# Patient Record
Sex: Female | Born: 1985 | Race: White | Hispanic: No | Marital: Single | Smoking: Current some day smoker
Health system: Southern US, Community
[De-identification: ages and names within clinical notes are randomized; demographics above are authoritative.]

## PROBLEM LIST (undated history)

## (undated) DIAGNOSIS — F419 Anxiety disorder, unspecified: Secondary | ICD-10-CM

## (undated) DIAGNOSIS — F32A Depression, unspecified: Secondary | ICD-10-CM

## (undated) DIAGNOSIS — D500 Iron deficiency anemia secondary to blood loss (chronic): Secondary | ICD-10-CM

## (undated) DIAGNOSIS — F329 Major depressive disorder, single episode, unspecified: Secondary | ICD-10-CM

## (undated) HISTORY — DX: Anxiety disorder, unspecified: F41.9

## (undated) HISTORY — DX: Depression, unspecified: F32.A

## (undated) HISTORY — PX: CHOLECYSTECTOMY: SHX55

## (undated) HISTORY — PX: WISDOM TOOTH EXTRACTION: SHX21

---
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
## 2009-05-31 ENCOUNTER — Observation Stay: Payer: Self-pay

## 2009-06-02 ENCOUNTER — Inpatient Hospital Stay: Payer: Self-pay

## 2013-06-19 ENCOUNTER — Ambulatory Visit: Payer: Self-pay | Admitting: Gastroenterology

## 2013-07-12 ENCOUNTER — Emergency Department: Payer: Self-pay | Admitting: Emergency Medicine

## 2013-07-12 LAB — CBC WITH DIFFERENTIAL/PLATELET
BASOS ABS: 0 10*3/uL (ref 0.0–0.1)
Basophil %: 0.4 %
EOS ABS: 0.2 10*3/uL (ref 0.0–0.7)
Eosinophil %: 2.1 %
HCT: 43.2 % (ref 35.0–47.0)
HGB: 14 g/dL (ref 12.0–16.0)
LYMPHS PCT: 31.5 %
Lymphocyte #: 2.7 10*3/uL (ref 1.0–3.6)
MCH: 29.4 pg (ref 26.0–34.0)
MCHC: 32.3 g/dL (ref 32.0–36.0)
MCV: 91 fL (ref 80–100)
MONO ABS: 0.4 x10 3/mm (ref 0.2–0.9)
MONOS PCT: 4.1 %
Neutrophil #: 5.3 10*3/uL (ref 1.4–6.5)
Neutrophil %: 61.9 %
Platelet: 272 10*3/uL (ref 150–440)
RBC: 4.75 10*6/uL (ref 3.80–5.20)
RDW: 13 % (ref 11.5–14.5)
WBC: 8.6 10*3/uL (ref 3.6–11.0)

## 2013-07-12 LAB — URINALYSIS, COMPLETE
BILIRUBIN, UR: NEGATIVE
Blood: NEGATIVE
Glucose,UR: NEGATIVE mg/dL (ref 0–75)
Leukocyte Esterase: NEGATIVE
Nitrite: NEGATIVE
Ph: 6 (ref 4.5–8.0)
Protein: NEGATIVE
RBC,UR: 4 /HPF (ref 0–5)
SPECIFIC GRAVITY: 1.026 (ref 1.003–1.030)
Squamous Epithelial: 1
WBC UR: 1 /HPF (ref 0–5)

## 2013-07-12 LAB — COMPREHENSIVE METABOLIC PANEL
ALBUMIN: 3.9 g/dL (ref 3.4–5.0)
ALT: 28 U/L (ref 12–78)
Alkaline Phosphatase: 96 U/L
Anion Gap: 5 — ABNORMAL LOW (ref 7–16)
BUN: 10 mg/dL (ref 7–18)
Bilirubin,Total: 0.5 mg/dL (ref 0.2–1.0)
CALCIUM: 8.6 mg/dL (ref 8.5–10.1)
Chloride: 106 mmol/L (ref 98–107)
Co2: 26 mmol/L (ref 21–32)
Creatinine: 0.85 mg/dL (ref 0.60–1.30)
EGFR (African American): >60
EGFR (Non-African Amer.): >60
GLUCOSE: 133 mg/dL — AB (ref 65–99)
Osmolality: 275 (ref 275–301)
Potassium: 3.9 mmol/L (ref 3.5–5.1)
SGOT(AST): 31 U/L (ref 15–37)
SODIUM: 137 mmol/L (ref 136–145)
TOTAL PROTEIN: 7.9 g/dL (ref 6.4–8.2)

## 2013-07-12 LAB — TROPONIN I: Troponin-I: <0.02 ng/mL

## 2013-07-12 LAB — LIPASE, BLOOD: Lipase: 120 U/L (ref 73–393)

## 2013-07-13 LAB — PREGNANCY, URINE: Pregnancy Test, Urine: NEGATIVE m[IU]/mL

## 2013-08-21 ENCOUNTER — Ambulatory Visit: Payer: Self-pay | Admitting: Surgery

## 2013-08-24 LAB — PATHOLOGY REPORT

## 2013-12-22 ENCOUNTER — Telehealth: Payer: Self-pay

## 2014-02-16 NOTE — Telephone Encounter (Signed)
Error

## 2014-05-08 NOTE — Op Note (Signed)
PATIENT NAME:  Rose Baker, [name] MR#:  [id number] DATE OF BIRTH:  [birth year]  DATE OF PROCEDURE:  08/21/2013  PREOPERATIVE DIAGNOSIS: Chronic cholecystitis, cholelithiasis.   POSTOPERATIVE DIAGNOSIS: Chronic cholecystitis, cholelithiasis.   PROCEDURE: Laparoscopic cholecystectomy, cholangiogram.   SURGEON: Renda RollsWilton Smith, MD  ANESTHESIA: General.   INDICATIONS: This 29 year old female recently had ultrasound that demonstrated gallstones. She also reports some intermittent right upper quadrant pains. Surgery was recommended for definitive treatment.   DESCRIPTION OF PROCEDURE: The patient was placed on the operating table in the supine position under general endotracheal anesthesia. The abdomen was prepared with ChloraPrep and draped in a sterile manner.   A short incision was made deep within the inferior aspect of the umbilicus and carried down to the deep fascia which was grasped with laryngeal hook and elevated. A Veress needle was inserted, aspirated and irrigated with a saline solution. Next, the peritoneal cavity was inflated with carbon dioxide. The Veress needle was removed. The 10 mm cannula was inserted. The 10 mm, 0 degree laparoscope was inserted to view the peritoneal cavity. Initially the pressures did not rise up to 15 mmHg as desired and it was necessary to change the carbon dioxide tubing and ultimately changed the air pressure pump and subsequently had satisfactory pressure and could see satisfactorily. Another incision was made in the epigastrium slightly to the right of the midline to introduce an 11 mm cannula. Two cannulas were inserted through small incisions in the lateral aspect of the right upper quadrant.  The liver appeared normal. The gallbladder was retracted towards the right shoulder. The infundibulum was retracted inferiorly and laterally. The porta hepatis was noted. Dissection was carried out to mobilize the gallbladder neck with incision of the visceral peritoneum.  The cystic duct was dissected free from surrounding structures. The cystic artery was dissected free from surrounding structures. A critical view of safety was demonstrated. An Endo Clip was placed across the cystic duct adjacent to the neck of the gallbladder. An incision was made in the cystic duct to introduce a Reddick catheter. Half-strength Conray-60 dye was injected as the cholangiogram was done with fluoroscopy demonstrating the biliary tree and prompt flow of dye into the duodenum. No retained stones were seen. The Reddick catheter was removed. The cystic duct was doubly ligated with endoclips and divided. The cystic artery was controlled with double endoclips and divided. The gallbladder was dissected free from the liver with hook and cautery. The gallbladder was delivered up through the infraumbilical incision, opened, suctioned and removed. It is noted that during the course of the procedure a picture was taken of the gallbladder and of the opened gallbladder and of the stone at the patient's request. It is further noted that the carbon dioxide was allowed to escape from the peritoneal cavity, as the cannulas were removed. Skin incisions were closed with interrupted 5-0 chromic subcuticular sutures, benzoin and Steri-Strips. Dressings were applied with paper tape. The patient tolerated surgery satisfactorily and was prepared for transfer to the recovery room.  ____________________________ Shela CommonsJ. Renda RollsWilton Smith, MD [email or web address] D: 08/21/2013 09:03:20 ET T: 08/21/2013 11:25:05 ET JOB#: [id number]  cc: Adella HareJ. Wilton Smith, MD, <Dictator> Adella HareWILTON J SMITH MD ELECTRONICALLY SIGNED 08/27/2013 18:33

## 2014-10-19 ENCOUNTER — Emergency Department
Admission: EM | Admit: 2014-10-19 | Discharge: 2014-10-19 | Disposition: A | Discharge status: Home / Self Care | Payer: Medicaid Other | Attending: Emergency Medicine | Admitting: Emergency Medicine

## 2014-10-19 DIAGNOSIS — Z720 Tobacco use: Secondary | ICD-10-CM | POA: Diagnosis not present

## 2014-10-19 DIAGNOSIS — J020 Streptococcal pharyngitis: Secondary | ICD-10-CM | POA: Diagnosis not present

## 2014-10-19 DIAGNOSIS — J029 Acute pharyngitis, unspecified: Secondary | ICD-10-CM | POA: Diagnosis present

## 2014-10-19 DIAGNOSIS — R000 Tachycardia, unspecified: Secondary | ICD-10-CM | POA: Insufficient documentation

## 2014-10-19 MED ORDER — ACETAMINOPHEN 325 MG PO TABS
650.0000 mg | ORAL_TABLET | Freq: Once | ORAL | Status: AC | PRN
  Administered 2014-10-19: 650 mg via ORAL

## 2014-10-19 MED ORDER — ACETAMINOPHEN 325 MG PO TABS
ORAL_TABLET | ORAL | Status: AC
  Administered 2014-10-19: 650 mg via ORAL
  Filled 2014-10-19: qty 2

## 2014-10-19 MED ORDER — PREDNISONE 50 MG PO TABS: ORAL_TABLET | ORAL | Status: DC

## 2014-10-19 MED ORDER — IBUPROFEN 800 MG PO TABS: 800.0000 mg | ORAL_TABLET | Freq: Three times a day (TID) | ORAL | Status: DC | PRN

## 2014-10-19 MED ORDER — AMOXICILLIN-POT CLAVULANATE 875-125 MG PO TABS: 1.0000 | ORAL_TABLET | Freq: Two times a day (BID) | ORAL | Status: AC

## 2014-10-19 NOTE — Discharge Instructions (Signed)

## 2014-10-19 NOTE — ED Notes (Signed)
Pt c/o sore throat with fever, bodyaches since sunday

## 2014-10-19 NOTE — ED Provider Notes (Signed)
[facility] Emergency Department Provider Note     Time seen: ----------------------------------------- 2:56 PM on 10/19/2014 -----------------------------------------    I have reviewed the triage vital signs and the nursing notes.   HISTORY  Chief Complaint Sore Throat and Fever    HPI Rose Baker is a 29 y.o. female who presents ER with several days of sore throat fever and bodyaches. Patient states it hurts to swallow but started as a mild sore throat. She has had fever and chills and difficulty swallowing.   History reviewed. No pertinent past medical history.  There are no active problems to display for this patient.   Past Surgical History  Procedure Laterality Date  . Cholecystectomy      Allergies Review of patient's allergies indicates no known allergies.  Social History Social History  Substance Use Topics  . Smoking status: Current Some Day Smoker    Types: Cigarettes  . Smokeless tobacco: None  . Alcohol Use: Yes    Review of Systems Constitutional: As if her fever Eyes: Negative for visual changes. ENT: Positive for sore throat, swollen glands Cardiovascular: Negative for chest pain. Respiratory: Negative for shortness of breath. Gastrointestinal: Negative for abdominal pain, vomiting and diarrhea. Genitourinary: Negative for dysuria. Musculoskeletal: Positive for bodyaches  Skin: Negative for rash. Neurological: Negative for headaches, focal weakness or numbness.  10-point ROS otherwise negative.  ____________________________________________   PHYSICAL EXAM:  VITAL SIGNS: ED Triage Vitals  Enc Vitals Group     BP 10/19/14 1417 118/77 mmHg     Pulse Rate 10/19/14 1417 132     Resp 10/19/14 1417 20     Temp 10/19/14 1417 102.2 F (39 C)     Temp Source 10/19/14 1417 Oral     SpO2 10/19/14 1417 97 %     Weight 10/19/14 1417 200 lb (90.719 kg)     Height 10/19/14 1417  (1.778 m)     Head Cir --       Peak Flow --      Pain Score 10/19/14 1427 7     Pain Loc --      Pain Edu? --      Excl. in GC? --     Constitutional: Alert and oriented. Well appearing and in no distress. Eyes: Conjunctivae are normal. PERRL. Normal extraocular movements. ENT   Head: Normocephalic and atraumatic.   Nose: No congestion/rhinnorhea.   Mouth/Throat: Markedly tongue slurred hypertrophy with erythema and exudate   Neck: Anterior cervical adenopathy Cardiovascular: Rapid rate, regular rhythm. Normal and symmetric distal pulses are present in all extremities. No murmurs, rubs, or gallops. Respiratory: Normal respiratory effort without tachypnea nor retractions. Breath sounds are clear and equal bilaterally. No wheezes/rales/rhonchi. Gastrointestinal: Soft and nontender. No distention. No abdominal bruits.  Musculoskeletal: Nontender with normal range of motion in all extremities. No joint effusions.  No lower extremity tenderness nor edema. Neurologic:  Normal speech and language. No gross focal neurologic deficits are appreciated. Speech is normal. No gait instability. Skin:  Skin is warm, dry and intact. No rash noted. Psychiatric: Mood and affect are normal. Speech and behavior are normal. Patient exhibits appropriate insight and judgment.  ____________________________________________  ED COURSE:  Pertinent labs & imaging results that were available during my care of the patient were reviewed by me and considered in my medical decision making (see chart for details). Patient's no acute distress, will check for strep and reevaluate ____________________________________________    LABS (pertinent positives/negatives)  Patient with a  rapid strep test that is positive   ____________________________________________  FINAL ASSESSMENT AND PLAN  Strep pharyngitis  Plan: Patient with labs and imaging as dictated above. Patient is no acute distress, is positive for strep pharyngitis.  She'll be discharged with Augmentin and a steroid taper. She is encouraged to return for worsening or worrisome symptoms.   Emily Filbert, MD   Emily Filbert, MD 10/19/14 [phone]

## 2014-10-21 LAB — POCT RAPID STREP A: STREPTOCOCCUS, GROUP A SCREEN (DIRECT): POSITIVE — AB

## 2015-04-04 ENCOUNTER — Ambulatory Visit: Payer: Self-pay | Admitting: Neurology

## 2015-04-06 ENCOUNTER — Ambulatory Visit: Payer: Self-pay | Admitting: Neurology

## 2016-02-10 DIAGNOSIS — R110 Nausea: Secondary | ICD-10-CM | POA: Diagnosis not present

## 2016-02-10 DIAGNOSIS — F17210 Nicotine dependence, cigarettes, uncomplicated: Secondary | ICD-10-CM | POA: Insufficient documentation

## 2016-02-10 DIAGNOSIS — R197 Diarrhea, unspecified: Secondary | ICD-10-CM | POA: Insufficient documentation

## 2016-02-10 DIAGNOSIS — Z79899 Other long term (current) drug therapy: Secondary | ICD-10-CM | POA: Diagnosis not present

## 2016-02-10 DIAGNOSIS — R109 Unspecified abdominal pain: Secondary | ICD-10-CM | POA: Diagnosis not present

## 2016-02-10 DIAGNOSIS — Z791 Long term (current) use of non-steroidal anti-inflammatories (NSAID): Secondary | ICD-10-CM | POA: Diagnosis not present

## 2016-02-11 ENCOUNTER — Emergency Department
Admission: EM | Admit: 2016-02-11 | Discharge: 2016-02-11 | Disposition: A | Discharge status: Home / Self Care | Payer: Medicaid Other | Attending: Emergency Medicine | Admitting: Emergency Medicine

## 2016-02-11 ENCOUNTER — Encounter: Payer: Self-pay | Admitting: Emergency Medicine

## 2016-02-11 DIAGNOSIS — R110 Nausea: Secondary | ICD-10-CM

## 2016-02-11 DIAGNOSIS — R197 Diarrhea, unspecified: Secondary | ICD-10-CM

## 2016-02-11 LAB — CBC
HEMATOCRIT: 42.3 % (ref 35.0–47.0)
HEMOGLOBIN: 14.8 g/dL (ref 12.0–16.0)
MCH: 30.5 pg (ref 26.0–34.0)
MCHC: 34.8 g/dL (ref 32.0–36.0)
MCV: 87.5 fL (ref 80.0–100.0)
Platelets: 227 10*3/uL (ref 150–440)
RBC: 4.84 MIL/uL (ref 3.80–5.20)
RDW: 12.5 % (ref 11.5–14.5)
WBC: 7.7 10*3/uL (ref 3.6–11.0)

## 2016-02-11 LAB — COMPREHENSIVE METABOLIC PANEL
ALBUMIN: 4.4 g/dL (ref 3.5–5.0)
ALK PHOS: 67 U/L (ref 38–126)
ALT: 30 U/L (ref 14–54)
ANION GAP: 5 (ref 5–15)
AST: 29 U/L (ref 15–41)
BUN: 14 mg/dL (ref 6–20)
CALCIUM: 8.6 mg/dL — AB (ref 8.9–10.3)
CO2: 26 mmol/L (ref 22–32)
Chloride: 106 mmol/L (ref 101–111)
Creatinine, Ser: 0.76 mg/dL (ref 0.44–1.00)
GFR calc Af Amer: >60 mL/min (ref >60)
GFR calc non Af Amer: >60 mL/min (ref >60)
GLUCOSE: 99 mg/dL (ref 65–99)
Potassium: 3.5 mmol/L (ref 3.5–5.1)
SODIUM: 137 mmol/L (ref 135–145)
Total Bilirubin: 0.6 mg/dL (ref 0.3–1.2)
Total Protein: 7.6 g/dL (ref 6.5–8.1)

## 2016-02-11 LAB — URINALYSIS, COMPLETE (UACMP) WITH MICROSCOPIC
BILIRUBIN URINE: NEGATIVE
GLUCOSE, UA: NEGATIVE mg/dL
HGB URINE DIPSTICK: NEGATIVE
Ketones, ur: 5 mg/dL — AB
Leukocytes, UA: NEGATIVE
Nitrite: NEGATIVE
PH: 5 (ref 5.0–8.0)
Protein, ur: NEGATIVE mg/dL
SPECIFIC GRAVITY, URINE: 1.033 — AB (ref 1.005–1.030)

## 2016-02-11 LAB — LIPASE, BLOOD: Lipase: 25 U/L (ref 11–51)

## 2016-02-11 LAB — POCT PREGNANCY, URINE: Preg Test, Ur: NEGATIVE

## 2016-02-11 MED ORDER — ONDANSETRON HCL 4 MG PO TABS: 4.0000 mg | ORAL_TABLET | Freq: Three times a day (TID) | ORAL | 0 refills | Status: DC | PRN

## 2016-02-11 MED ORDER — DICYCLOMINE HCL 20 MG PO TABS
20.0000 mg | ORAL_TABLET | Freq: Three times a day (TID) | ORAL | 0 refills | Status: DC | PRN
Start: 2016-02-11 — End: 2018-12-25

## 2016-02-11 NOTE — ED Triage Notes (Signed)
Pt states that she has been having diarrhea x3 days. Pt also reports that "something is wrong with my rectum". Pt reports that "something is coming out". Pt has been Pepto Bismol to know effect. Pt is ambulatory in triage with NAD noted at this time.

## 2016-02-11 NOTE — ED Provider Notes (Signed)
[facility] Emergency Department Provider Note  ____________________________________________   I have reviewed the triage vital signs and the nursing notes.   HISTORY  Chief Complaint Diarrhea   History limited by: Not Limited   HPI Rose Baker is a 31 y.o. female who presents to the emergency department today because of concerns for abdominal pain and diarrhea. The patient states that the symptoms started a few days ago. She describes the pain as cramping. It is around all of the abdomen. This has been accompanied by diarrhea. The patient has also had nausea although no vomiting. The patient has not had any fevers. Has tried Pepto-Bismol without any relief. Denies any abnormal ingestions, recent travel. No known sick contacts.  History reviewed. No pertinent past medical history.  There are no active problems to display for this patient.   Past Surgical History:  Procedure Laterality Date  . CHOLECYSTECTOMY      Prior to Admission medications   Medication Sig Start Date End Date Taking? Authorizing Provider  ibuprofen (ADVIL,MOTRIN) 800 MG tablet Take 1 tablet (800 mg total) by mouth every 8 (eight) hours as needed. 10/19/14   Emily FilbertJonathan E Williams, MD  predniSONE (DELTASONE) 50 MG tablet Take one tablet by mouth daily 10/19/14   Emily FilbertJonathan E Williams, MD    Allergies Patient has no known allergies.  No family history on file.  Social History Social History  Substance Use Topics  . Smoking status: Current Some Day Smoker    Types: Cigarettes  . Smokeless tobacco: Never Used  . Alcohol use Yes    Review of Systems  Constitutional: Negative for fever. Cardiovascular: Negative for chest pain. Respiratory: Negative for shortness of breath. Gastrointestinal: Positive for abdominal pain, diarrhea and nausea.  Neurological: Negative for headaches, focal weakness or numbness.  10-point ROS otherwise  negative.  ____________________________________________   PHYSICAL EXAM:  VITAL SIGNS: ED Triage Vitals  Enc Vitals Group     BP 02/11/16 0023 109/71     Pulse Rate 02/11/16 0023 76     Resp 02/11/16 0023 16     Temp 02/11/16 0023 98 F (36.7 C)     Temp Source 02/11/16 0023 Oral     SpO2 02/11/16 0023 96 %     Weight 02/11/16 0020 220 lb (99.8 kg)     Height 02/11/16 0020 5\' 9"  (1.753 m)     Head Circumference --      Peak Flow --      Pain Score 02/11/16 0021 7   Constitutional: Alert and oriented. Well appearing and in no distress. Eyes: Conjunctivae are normal. Normal extraocular movements. ENT   Head: Normocephalic and atraumatic.   Nose: No congestion/rhinnorhea.   Mouth/Throat: Mucous membranes are moist.   Neck: No stridor. Hematological/Lymphatic/Immunilogical: No cervical lymphadenopathy. Cardiovascular: Normal rate, regular rhythm.  No murmurs, rubs, or gallops.  Respiratory: Normal respiratory effort without tachypnea nor retractions. Breath sounds are clear and equal bilaterally. No wheezes/rales/rhonchi. Gastrointestinal: Soft and non tender. No rebound. No guarding.  Genitourinary: Deferred Musculoskeletal: Normal range of motion in all extremities. No lower extremity edema. Neurologic:  Normal speech and language. No gross focal neurologic deficits are appreciated.  Skin:  Skin is warm, dry and intact. No rash noted. Psychiatric: Mood and affect are normal. Speech and behavior are normal. Patient exhibits appropriate insight and judgment.  ____________________________________________    LABS (pertinent positives/negatives)  Labs Reviewed  COMPREHENSIVE METABOLIC PANEL - Abnormal; Notable for the following:  Result Value   Calcium 8.6 (*)    All other components within normal limits  URINALYSIS, COMPLETE (UACMP) WITH MICROSCOPIC - Abnormal; Notable for the following:    Color, Urine YELLOW (*)    APPearance CLEAR (*)    Specific  Gravity, Urine 1.033 (*)    Ketones, ur 5 (*)    All other components within normal limits  LIPASE, BLOOD  CBC  POCT PREGNANCY, URINE     ____________________________________________   EKG  None  ____________________________________________    RADIOLOGY  None  ____________________________________________   PROCEDURES  Procedures  ____________________________________________   INITIAL IMPRESSION / ASSESSMENT AND PLAN / ED COURSE  Pertinent labs & imaging results that were available during my care of the patient were reviewed by me and considered in my medical decision making (see chart for details).  Patient presents to the emergency department today because of concerns for abdominal cramping, diarrhea and nausea. I think likely patient with gastroenteritis. Blood work without any concerning findings. Patient does also complain of a sore rectal area she thinks from the diarrhea. Did discuss patient that we will discharge with those medicines as well as Bentyl for the abdominal cramping.  ____________________________________________   FINAL CLINICAL IMPRESSION(S) / ED DIAGNOSES  Final diagnoses:  Diarrhea, unspecified type  Nausea     Note: This dictation was prepared with Dragon dictation. Any transcriptional errors that result from this process are unintentional     Phineas Semen, MD 02/11/16 [phone]

## 2016-02-11 NOTE — Discharge Instructions (Signed)
Please seek medical attention for any high fevers, chest pain, shortness of breath, change in behavior, persistent vomiting, bloody stool or any other new or concerning symptoms.  

## 2016-02-11 NOTE — ED Notes (Addendum)
Patient c/o diarrhea X 3 days and rectal pain. Patient reports black watery diarrhea. Patient reports bright red blood in stools

## 2016-03-29 ENCOUNTER — Ambulatory Visit: Payer: Self-pay | Admitting: Obstetrics and Gynecology

## 2016-06-25 ENCOUNTER — Emergency Department
Admission: EM | Admit: 2016-06-25 | Discharge: 2016-06-25 | Disposition: A | Discharge status: Home / Self Care | Payer: Medicaid Other | Attending: Emergency Medicine | Admitting: Emergency Medicine

## 2016-06-25 ENCOUNTER — Encounter: Payer: Self-pay | Admitting: Emergency Medicine

## 2016-06-25 DIAGNOSIS — F17210 Nicotine dependence, cigarettes, uncomplicated: Secondary | ICD-10-CM | POA: Insufficient documentation

## 2016-06-25 DIAGNOSIS — Z79899 Other long term (current) drug therapy: Secondary | ICD-10-CM | POA: Insufficient documentation

## 2016-06-25 DIAGNOSIS — R109 Unspecified abdominal pain: Secondary | ICD-10-CM

## 2016-06-25 DIAGNOSIS — Z9049 Acquired absence of other specified parts of digestive tract: Secondary | ICD-10-CM | POA: Diagnosis not present

## 2016-06-25 DIAGNOSIS — Z791 Long term (current) use of non-steroidal anti-inflammatories (NSAID): Secondary | ICD-10-CM | POA: Diagnosis not present

## 2016-06-25 DIAGNOSIS — R112 Nausea with vomiting, unspecified: Secondary | ICD-10-CM | POA: Diagnosis present

## 2016-06-25 DIAGNOSIS — R1031 Right lower quadrant pain: Secondary | ICD-10-CM | POA: Diagnosis not present

## 2016-06-25 LAB — COMPREHENSIVE METABOLIC PANEL
ALBUMIN: 4.6 g/dL (ref 3.5–5.0)
ALT: 14 U/L (ref 14–54)
ANION GAP: 8 (ref 5–15)
AST: 24 U/L (ref 15–41)
Alkaline Phosphatase: 58 U/L (ref 38–126)
BILIRUBIN TOTAL: 1.2 mg/dL (ref 0.3–1.2)
BUN: 14 mg/dL (ref 6–20)
CO2: 25 mmol/L (ref 22–32)
Calcium: 9.3 mg/dL (ref 8.9–10.3)
Chloride: 110 mmol/L (ref 101–111)
Creatinine, Ser: 0.87 mg/dL (ref 0.44–1.00)
GFR calc Af Amer: >60 mL/min (ref >60)
GFR calc non Af Amer: >60 mL/min (ref >60)
GLUCOSE: 140 mg/dL — AB (ref 65–99)
POTASSIUM: 3.7 mmol/L (ref 3.5–5.1)
SODIUM: 143 mmol/L (ref 135–145)
TOTAL PROTEIN: 8 g/dL (ref 6.5–8.1)

## 2016-06-25 LAB — CBC
HEMATOCRIT: 45.5 % (ref 35.0–47.0)
HEMOGLOBIN: 15.5 g/dL (ref 12.0–16.0)
MCH: 30.3 pg (ref 26.0–34.0)
MCHC: 34 g/dL (ref 32.0–36.0)
MCV: 89.1 fL (ref 80.0–100.0)
Platelets: 272 10*3/uL (ref 150–440)
RBC: 5.11 MIL/uL (ref 3.80–5.20)
RDW: 12.4 % (ref 11.5–14.5)
WBC: 13 10*3/uL — ABNORMAL HIGH (ref 3.6–11.0)

## 2016-06-25 LAB — URINALYSIS, COMPLETE (UACMP) WITH MICROSCOPIC
BILIRUBIN URINE: NEGATIVE
Bacteria, UA: NONE SEEN
Glucose, UA: NEGATIVE mg/dL
KETONES UR: 5 mg/dL — AB
LEUKOCYTES UA: NEGATIVE
NITRITE: NEGATIVE
PROTEIN: 30 mg/dL — AB
Specific Gravity, Urine: 1.03 (ref 1.005–1.030)
pH: 5 (ref 5.0–8.0)

## 2016-06-25 LAB — PREGNANCY, URINE: PREG TEST UR: NEGATIVE

## 2016-06-25 LAB — LIPASE, BLOOD: Lipase: 21 U/L (ref 11–51)

## 2016-06-25 MED ORDER — ONDANSETRON HCL 4 MG PO TABS: 4.0000 mg | ORAL_TABLET | Freq: Three times a day (TID) | ORAL | 0 refills | Status: DC | PRN

## 2016-06-25 MED ORDER — DICYCLOMINE HCL 10 MG PO CAPS: 20.0000 mg | ORAL_CAPSULE | Freq: Once | ORAL | Status: DC

## 2016-06-25 MED ORDER — ONDANSETRON 4 MG PO TBDP
4.0000 mg | ORAL_TABLET | Freq: Once | ORAL | Status: AC
  Administered 2016-06-25: 4 mg via ORAL
  Filled 2016-06-25: qty 1

## 2016-06-25 MED ORDER — DICYCLOMINE HCL 10 MG/ML IM SOLN
20.0000 mg | Freq: Once | INTRAMUSCULAR | Status: DC
  Filled 2016-06-25: qty 2

## 2016-06-25 MED ORDER — DICYCLOMINE HCL 20 MG PO TABS
ORAL_TABLET | ORAL | Status: AC
  Administered 2016-06-25: 20 mg
  Filled 2016-06-25: qty 1

## 2016-06-25 MED ORDER — DICYCLOMINE HCL 20 MG PO TABS: 20.0000 mg | ORAL_TABLET | Freq: Three times a day (TID) | ORAL | 0 refills | Status: DC | PRN

## 2016-06-25 NOTE — ED Provider Notes (Signed)
[facility] Emergency Department Provider Note   ____________________________________________   I have reviewed the triage vital signs and the nursing notes.   HISTORY  Chief Complaint Abdominal Pain; Emesis; and Diarrhea   History limited by: Not Limited   HPI Rose Baker is a 31 y.o. female who presents to the emergency department today because of concerns for abdominal pain, nausea vomiting. The patient states the symptoms started yesterday. The abdominal pain is located centrally. She describes it as being sharp. It is very intermittent. It will last for about 1 minute and then slowly go away. The patient states that this has been accompanied by nausea and inability to tolerate by mouth. She has also had some diarrhea. No unusual ingestions. No known sick contacts. Patient denies similar symptoms in the past.   History reviewed. No pertinent past medical history.  There are no active problems to display for this patient.   Past Surgical History:  Procedure Laterality Date  . CHOLECYSTECTOMY      Prior to Admission medications   Medication Sig Start Date End Date Taking? Authorizing Provider  dicyclomine (BENTYL) 20 MG tablet Take 1 tablet (20 mg total) by mouth 3 (three) times daily as needed (abdominal pain). 02/11/16   Phineas SemenGoodman, Mikenzie Mccannon, MD  ibuprofen (ADVIL,MOTRIN) 800 MG tablet Take 1 tablet (800 mg total) by mouth every 8 (eight) hours as needed. 10/19/14   Emily FilbertWilliams, Jonathan E, MD  ondansetron (ZOFRAN) 4 MG tablet Take 1 tablet (4 mg total) by mouth every 8 (eight) hours as needed for nausea or vomiting. 02/11/16   Phineas SemenGoodman, Mckenzye Cutright, MD  predniSONE (DELTASONE) 50 MG tablet Take one tablet by mouth daily 10/19/14   Emily FilbertWilliams, Jonathan E, MD    Allergies Patient has no known allergies.  No family history on file.  Social History Social History  Substance Use Topics  . Smoking status: Current Some Day Smoker    Types: Cigarettes  . Smokeless  tobacco: Never Used  . Alcohol use Yes    Review of Systems Constitutional: No fever/chills Eyes: No visual changes. ENT: No sore throat. Cardiovascular: Denies chest pain. Respiratory: Denies shortness of breath. Gastrointestinal: Positive for abdominal pain, nausea, vomiting and diarrhea. Genitourinary: Negative for dysuria. Musculoskeletal: Negative for back pain. Skin: Negative for rash. Neurological: Negative for headaches, focal weakness or numbness.  ____________________________________________   PHYSICAL EXAM:  VITAL SIGNS: ED Triage Vitals [06/25/16 1207]  Enc Vitals Group     BP 125/72     Pulse Rate (!) 58     Resp 18     Temp 98.3 F (36.8 C)     Temp Source Oral     SpO2 98 %     Weight 220 lb (99.8 kg)     Height 5\' 9"  (1.753 m)     Head Circumference      Peak Flow      Pain Score 10   Constitutional: Alert and oriented. Well appearing and in no distress. Eyes: Conjunctivae are normal.  ENT   Head: Normocephalic and atraumatic.   Nose: No congestion/rhinnorhea.   Mouth/Throat: Mucous membranes are moist.   Neck: No stridor. Hematological/Lymphatic/Immunilogical: No cervical lymphadenopathy. Cardiovascular: Normal rate, regular rhythm.  No murmurs, rubs, or gallops.  Respiratory: Normal respiratory effort without tachypnea nor retractions. Breath sounds are clear and equal bilaterally. No wheezes/rales/rhonchi. Gastrointestinal: Soft and tender to palpation somewhat diffusely. No rebound. No guarding.  Genitourinary: Deferred Musculoskeletal: Normal range of motion in all extremities. No lower extremity  edema. Neurologic:  Normal speech and language. No gross focal neurologic deficits are appreciated.  Skin:  Skin is warm, dry and intact. No rash noted. Psychiatric: Mood and affect are normal. Speech and behavior are normal. Patient exhibits appropriate insight and judgment.  ____________________________________________    LABS  (pertinent positives/negatives)  Labs Reviewed  COMPREHENSIVE METABOLIC PANEL - Abnormal; Notable for the following:       Result Value   Glucose, Bld 140 (*)    All other components within normal limits  CBC - Abnormal; Notable for the following:    WBC 13.0 (*)    All other components within normal limits  URINALYSIS, COMPLETE (UACMP) WITH MICROSCOPIC - Abnormal; Notable for the following:    Color, Urine YELLOW (*)    APPearance HAZY (*)    Hgb urine dipstick SMALL (*)    Ketones, ur 5 (*)    Protein, ur 30 (*)    Squamous Epithelial / LPF 0-5 (*)    All other components within normal limits  LIPASE, BLOOD  PREGNANCY, URINE     ____________________________________________   EKG  None  ____________________________________________    RADIOLOGY  None  ____________________________________________   PROCEDURES  Procedures  ____________________________________________   INITIAL IMPRESSION / ASSESSMENT AND PLAN / ED COURSE  Pertinent labs & imaging results that were available during my care of the patient were reviewed by me and considered in my medical decision making (see chart for details).  Patient presented to the emergency department today because of concerns for nausea vomiting abdominal pain and diarrhea. Patient did feel better after Bentyl and Zofran. Blood work with a very mild leukocytosis. Abdominal exam is somewhat diffuse tenderness without any focal tenderness in the right lower quadrant. This point I did not think an emergent imaging is necessary. Did discuss return precautions with patient. Will discharge with Bentyl and Zofran.  ____________________________________________   FINAL CLINICAL IMPRESSION(S) / ED DIAGNOSES  Final diagnoses:  Abdominal pain, unspecified abdominal location     Note: This dictation was prepared with Dragon dictation. Any transcriptional errors that result from this process are unintentional     Phineas Semen, MD 06/25/16 1740

## 2016-06-25 NOTE — ED Notes (Signed)
Lab called to add on urine preg at this time  

## 2016-06-25 NOTE — ED Triage Notes (Signed)
Pt reports lower abdominal cramping and vomiting and diarrhea that began last night.

## 2016-06-25 NOTE — Discharge Instructions (Signed)
Please seek medical attention for any high fevers, chest pain, shortness of breath, change in behavior, persistent vomiting, bloody stool or any other new or concerning symptoms.  

## 2017-08-21 ENCOUNTER — Emergency Department
Admission: EM | Admit: 2017-08-21 | Discharge: 2017-08-21 | Disposition: A | Discharge status: Home / Self Care | Payer: Medicaid Other | Attending: Emergency Medicine | Admitting: Emergency Medicine

## 2017-08-21 ENCOUNTER — Encounter: Payer: Self-pay | Admitting: Emergency Medicine

## 2017-08-21 DIAGNOSIS — F17210 Nicotine dependence, cigarettes, uncomplicated: Secondary | ICD-10-CM | POA: Diagnosis not present

## 2017-08-21 DIAGNOSIS — E282 Polycystic ovarian syndrome: Secondary | ICD-10-CM

## 2017-08-21 DIAGNOSIS — N939 Abnormal uterine and vaginal bleeding, unspecified: Secondary | ICD-10-CM | POA: Diagnosis present

## 2017-08-21 DIAGNOSIS — R1030 Lower abdominal pain, unspecified: Secondary | ICD-10-CM | POA: Insufficient documentation

## 2017-08-21 DIAGNOSIS — N938 Other specified abnormal uterine and vaginal bleeding: Secondary | ICD-10-CM | POA: Diagnosis not present

## 2017-08-21 LAB — COMPREHENSIVE METABOLIC PANEL
ALBUMIN: 4.5 g/dL (ref 3.5–5.0)
ALT: 12 U/L (ref 0–44)
AST: 17 U/L (ref 15–41)
Alkaline Phosphatase: 56 U/L (ref 38–126)
Anion gap: 7 (ref 5–15)
BILIRUBIN TOTAL: 1.3 mg/dL — AB (ref 0.3–1.2)
BUN: 13 mg/dL (ref 6–20)
CO2: 27 mmol/L (ref 22–32)
Calcium: 9.2 mg/dL (ref 8.9–10.3)
Chloride: 105 mmol/L (ref 98–111)
Creatinine, Ser: 0.82 mg/dL (ref 0.44–1.00)
GFR calc Af Amer: >60 mL/min (ref >60)
GFR calc non Af Amer: >60 mL/min (ref >60)
GLUCOSE: 99 mg/dL (ref 70–99)
POTASSIUM: 3.9 mmol/L (ref 3.5–5.1)
Sodium: 139 mmol/L (ref 135–145)
TOTAL PROTEIN: 7.9 g/dL (ref 6.5–8.1)

## 2017-08-21 LAB — CBC
HEMATOCRIT: 38.3 % (ref 35.0–47.0)
Hemoglobin: 12.9 g/dL (ref 12.0–16.0)
MCH: 30.3 pg (ref 26.0–34.0)
MCHC: 33.6 g/dL (ref 32.0–36.0)
MCV: 90 fL (ref 80.0–100.0)
Platelets: 294 10*3/uL (ref 150–440)
RBC: 4.26 MIL/uL (ref 3.80–5.20)
RDW: 12.5 % (ref 11.5–14.5)
WBC: 10.6 10*3/uL (ref 3.6–11.0)

## 2017-08-21 LAB — LIPASE, BLOOD: Lipase: 26 U/L (ref 11–51)

## 2017-08-21 LAB — HCG, QUANTITATIVE, PREGNANCY: hCG, Beta Chain, Quant, S: <1 m[IU]/mL (ref <5)

## 2017-08-21 MED ORDER — MEDROXYPROGESTERONE ACETATE 10 MG PO TABS: 20.0000 mg | ORAL_TABLET | Freq: Every day | ORAL | 0 refills | Status: DC

## 2017-08-21 NOTE — ED Triage Notes (Signed)
PT arrived with complaints of lower abdominal pain that started yesterday morning. Pt states she is currently on her period and has had her period consistently for the last month. Pt denies ability to seek medical advice on ongoing vaginal bleeding. Pt in NAD in triage

## 2017-08-21 NOTE — ED Provider Notes (Addendum)
[facility] Emergency Department Provider Note  ____________________________________________  Time seen: Approximately 4:48 PM  I have reviewed the triage vital signs and the nursing notes.   HISTORY  Chief Complaint Abdominal Pain    HPI Rose Baker is a 32 y.o. female with a history of PCOS and cholecystectomy who complains of vaginal bleeding for the past month.  She has had some days of no bleeding, but over the past 4 weeks has bled most days.  Prior to this, she had an Nexplanon for 3 years which was removed about 5 months ago.  During that time she had secondary amenorrhea.  Afterwards she was on oral contraceptives which  were associated with regular menses once monthly.  She stopped those a few months ago because she feels averse to being on oral contraceptives.  In this timeframe she has had a regular periods.  Last sexual activity was greater than a month ago.  She denies any vaginal discharge.  She has some lower abdominal pain that started yesterday, intermittent, no aggravating or alleviating factors, mild, crampy, nonradiating.  No urinary symptoms     History reviewed. No pertinent past medical history. PCOS reported by patient, not on any controlling medications.  There are no active problems to display for this patient.    Past Surgical History:  Procedure Laterality Date  . CHOLECYSTECTOMY       Prior to Admission medications   Medication Sig Start Date End Date Taking? Authorizing Provider  dicyclomine (BENTYL) 20 MG tablet Take 1 tablet (20 mg total) by mouth 3 (three) times daily as needed (abdominal pain). 02/11/16   Phineas Semen, MD  dicyclomine (BENTYL) 20 MG tablet Take 1 tablet (20 mg total) by mouth 3 (three) times daily as needed (abdominal pain). 06/25/16   Phineas Semen, MD  ibuprofen (ADVIL,MOTRIN) 800 MG tablet Take 1 tablet (800 mg total) by mouth every 8 (eight) hours as needed. 10/19/14   Emily Filbert,  MD  medroxyPROGESTERone (PROVERA) 10 MG tablet Take 2 tablets (20 mg total) by mouth daily. 08/21/17   Sharman Cheek, MD  ondansetron (ZOFRAN) 4 MG tablet Take 1 tablet (4 mg total) by mouth every 8 (eight) hours as needed for nausea or vomiting. 02/11/16   Phineas Semen, MD  ondansetron (ZOFRAN) 4 MG tablet Take 1 tablet (4 mg total) by mouth every 8 (eight) hours as needed for nausea or vomiting. 06/25/16   Phineas Semen, MD  predniSONE (DELTASONE) 50 MG tablet Take one tablet by mouth daily 10/19/14   Emily Filbert, MD     Allergies Patient has no known allergies.   No family history on file.  Social History Social History   Tobacco Use  . Smoking status: Current Some Day Smoker    Types: Cigarettes  . Smokeless tobacco: Never Used  Substance Use Topics  . Alcohol use: Yes  . Drug use: No    Review of Systems  Constitutional:   No fever or chills.  ENT:   No sore throat. No rhinorrhea. Cardiovascular:   No chest pain or syncope. Respiratory:   No dyspnea or cough. Gastrointestinal:   Positive as above for lower abdominal pain without vomiting or diarrhea.  Musculoskeletal:   Negative for focal pain or swelling All other systems reviewed and are negative except as documented above in ROS and HPI.  ____________________________________________   PHYSICAL EXAM:  VITAL SIGNS: ED Triage Vitals  Enc Vitals Group     BP 08/21/17 1312 128/82  Pulse Rate 08/21/17 1312 71     Resp 08/21/17 1312 18     Temp 08/21/17 1312 98.5 F (36.9 C)     Temp Source 08/21/17 1312 Oral     SpO2 08/21/17 1312 100 %     Weight 08/21/17 1313 220 lb (99.8 kg)     Height 08/21/17 1313 5\' 9"  (1.753 m)     Head Circumference --      Peak Flow --      Pain Score 08/21/17 1313 8     Pain Loc --      Pain Edu? --      Excl. in GC? --     Vital signs reviewed, nursing assessments reviewed.   Constitutional:   Alert and oriented. Non-toxic appearance. Eyes:    Conjunctivae are normal. EOMI. PERRL. ENT      Head:   Normocephalic and atraumatic.      Nose:   No congestion/rhinnorhea.       Mouth/Throat:   MMM, no pharyngeal erythema. No peritonsillar mass.       Neck:   No meningismus. Full ROM. Hematological/Lymphatic/Immunilogical:   No cervical lymphadenopathy. Cardiovascular:   RRR. Symmetric bilateral radial and DP pulses.  No murmurs. Cap refill less than 2 seconds. Respiratory:   Normal respiratory effort without tachypnea/retractions. Breath sounds are clear and equal bilaterally. No wheezes/rales/rhonchi. Gastrointestinal:   Soft with mild suprapubic tenderness.  Non distended. There is no CVA tenderness.  No rebound, rigidity, or guarding. Musculoskeletal:   Normal range of motion in all extremities. No joint effusions.  No lower extremity tenderness.  No edema. Neurologic:   Normal speech and language.  Motor grossly intact. No acute focal neurologic deficits are appreciated.  Skin:    Skin is warm, dry and intact. No rash noted.  No petechiae, purpura, or bullae.  ____________________________________________    LABS (pertinent positives/negatives) (all labs ordered are listed, but only abnormal results are displayed) Labs Reviewed  COMPREHENSIVE METABOLIC PANEL - Abnormal; Notable for the following components:      Result Value   Total Bilirubin 1.3 (*)    All other components within normal limits  LIPASE, BLOOD  CBC  HCG, QUANTITATIVE, PREGNANCY  URINALYSIS, COMPLETE (UACMP) WITH MICROSCOPIC   ____________________________________________   EKG    ____________________________________________    RADIOLOGY  No results found.  ____________________________________________   PROCEDURES Procedures  ____________________________________________    CLINICAL IMPRESSION / ASSESSMENT AND PLAN / ED COURSE  Pertinent labs & imaging results that were available during my care of the patient were reviewed by me and  considered in my medical decision making (see chart for details).    Patient presents with metrorrhagia in the setting of cessation of hormonal contraceptives and a history of PCOS.  This is not surprising.  I advised the patient she will likely need to remain on hormonal contraceptives to regulate her periods, but she reports a strong desire to not take contraceptives.  Her vital signs are normal, labs today are normal.  Not pregnant.  Patient is amenable to taking a one-week course of Provera for symptom relief.  Advised her that if she continues bleeding after the initial withdrawal bleed when the Provera course is completed she will need to follow-up with gynecology for further evaluation.  Doubt STI or PID at this time, no symptoms to suggest TOA or torsion.  Not appendicitis or UTI      ____________________________________________   FINAL CLINICAL IMPRESSION(S) / ED DIAGNOSES  Final diagnoses:  Dysfunctional uterine bleeding  PCOS (polycystic ovarian syndrome)     ED Discharge Orders        Ordered    medroxyPROGESTERone (PROVERA) 10 MG tablet  Daily     08/21/17 1647      Portions of this note were generated with dragon dictation software. Dictation errors may occur despite best attempts at proofreading.    Sharman CheekStafford, Surabhi Gadea, MD 08/21/17 1653    Sharman CheekStafford, Pedram Goodchild, MD 08/21/17 [phone]

## 2018-01-13 ENCOUNTER — Ambulatory Visit
Admission: EM | Admit: 2018-01-13 | Discharge: 2018-01-13 | Disposition: A | Discharge status: Home / Self Care | Payer: Medicaid Other | Attending: Physician Assistant | Admitting: Physician Assistant

## 2018-01-13 ENCOUNTER — Other Ambulatory Visit: Payer: Self-pay

## 2018-01-13 ENCOUNTER — Encounter: Payer: Self-pay | Admitting: Emergency Medicine

## 2018-01-13 DIAGNOSIS — F17210 Nicotine dependence, cigarettes, uncomplicated: Secondary | ICD-10-CM | POA: Diagnosis not present

## 2018-01-13 DIAGNOSIS — R519 Headache, unspecified: Secondary | ICD-10-CM

## 2018-01-13 DIAGNOSIS — R05 Cough: Secondary | ICD-10-CM | POA: Diagnosis present

## 2018-01-13 DIAGNOSIS — Z791 Long term (current) use of non-steroidal anti-inflammatories (NSAID): Secondary | ICD-10-CM | POA: Diagnosis not present

## 2018-01-13 DIAGNOSIS — Z793 Long term (current) use of hormonal contraceptives: Secondary | ICD-10-CM | POA: Insufficient documentation

## 2018-01-13 DIAGNOSIS — R51 Headache: Secondary | ICD-10-CM | POA: Insufficient documentation

## 2018-01-13 DIAGNOSIS — J101 Influenza due to other identified influenza virus with other respiratory manifestations: Secondary | ICD-10-CM | POA: Insufficient documentation

## 2018-01-13 DIAGNOSIS — Z79899 Other long term (current) drug therapy: Secondary | ICD-10-CM | POA: Diagnosis not present

## 2018-01-13 LAB — RAPID INFLUENZA A&B ANTIGENS
Influenza A (ARMC): NEGATIVE
Influenza B (ARMC): POSITIVE — AB

## 2018-01-13 MED ORDER — OSELTAMIVIR PHOSPHATE 75 MG PO CAPS: 75.0000 mg | ORAL_CAPSULE | Freq: Two times a day (BID) | ORAL | 0 refills | Status: AC

## 2018-01-13 MED ORDER — KETOROLAC TROMETHAMINE 60 MG/2ML IM SOLN
60.0000 mg | Freq: Once | INTRAMUSCULAR | Status: AC
  Administered 2018-01-13: 60 mg via INTRAMUSCULAR

## 2018-01-13 MED ORDER — IBUPROFEN 800 MG PO TABS: 800.0000 mg | ORAL_TABLET | Freq: Three times a day (TID) | ORAL | 0 refills | Status: AC | PRN

## 2018-01-13 NOTE — Discharge Instructions (Addendum)
FLU: Stressed the need to increase rest and fluid/electrolyte intake . Use medications as directed including Tamiflu and OTC cough medications. If breathing becomes a concern follow back up with us or go to the ER. Avoid others and do not return to work/school too soon in order to prevent others from becoming infected. Most get better within 1-2 wks. Call PCP if  trouble breathing or are short of breath, feel pain or pressure in your chest or abdomen, get dizzy, feel confused, or have vomiting  

## 2018-01-13 NOTE — ED Provider Notes (Signed)
[facility]    CSN: [id number] Arrival date & time: 01/13/18  4098     History   Chief Complaint Chief Complaint  Patient presents with  . Headache  . Cough    HPI Rose Baker is a 32 y.o. female. Patient presents today for 1 week history of nasal congestion, headaches, and sinus pressure/pain. Admits to onset of fever up to 101 degrees 2-3 days ago. She says the headache has worsened and she feels pain and pressure behind her eyes. Admits to tenderness of left maxillary sinus. She has no other concerns/complaints today.  She says she has recently been having increased fatigue and body aches. She has taken OTC Mucinex, Theraflu and used nasal sprays without any improvement in symptoms.  HPI  History reviewed. No pertinent past medical history.  There are no active problems to display for this patient.   Past Surgical History:  Procedure Laterality Date  . CHOLECYSTECTOMY      OB History   No obstetric history on file.      Home Medications    Prior to Admission medications   Medication Sig Start Date End Date Taking? Authorizing Provider  dicyclomine (BENTYL) 20 MG tablet Take 1 tablet (20 mg total) by mouth 3 (three) times daily as needed (abdominal pain). 02/11/16   Phineas Semen, MD  dicyclomine (BENTYL) 20 MG tablet Take 1 tablet (20 mg total) by mouth 3 (three) times daily as needed (abdominal pain). 06/25/16   Phineas Semen, MD  ibuprofen (ADVIL,MOTRIN) 800 MG tablet Take 1 tablet (800 mg total) by mouth every 8 (eight) hours as needed for up to 7 days. 01/13/18 01/20/18  Shirlee Latch, PA-C  medroxyPROGESTERone (PROVERA) 10 MG tablet Take 2 tablets (20 mg total) by mouth daily. 08/21/17   Sharman Cheek, MD  ondansetron (ZOFRAN) 4 MG tablet Take 1 tablet (4 mg total) by mouth every 8 (eight) hours as needed for nausea or vomiting. 02/11/16   Phineas Semen, MD  ondansetron (ZOFRAN) 4 MG tablet Take 1 tablet (4 mg total) by mouth every 8  (eight) hours as needed for nausea or vomiting. 06/25/16   Phineas Semen, MD  oseltamivir (TAMIFLU) 75 MG capsule Take 1 capsule (75 mg total) by mouth 2 (two) times daily for 5 days. 01/13/18 01/18/18  Shirlee Latch, PA-C  predniSONE (DELTASONE) 50 MG tablet Take one tablet by mouth daily 10/19/14   Emily Filbert, MD    Family History Family History  Problem Relation Age of Onset  . Healthy Mother   . Diabetes Father     Social History Social History   Tobacco Use  . Smoking status: Current Some Day Smoker    Types: Cigarettes  . Smokeless tobacco: Never Used  Substance Use Topics  . Alcohol use: Not Currently  . Drug use: No     Allergies   Patient has no known allergies.   Review of Systems Review of Systems  Constitutional: Positive for chills, fatigue and fever. Negative for appetite change.  HENT: Positive for congestion, postnasal drip, rhinorrhea, sinus pressure and sinus pain. Negative for ear pain and sore throat.   Eyes: Positive for photophobia and pain (pain behind both eyes). Negative for discharge.  Respiratory: Positive for cough. Negative for shortness of breath and wheezing.   Cardiovascular: Negative for chest pain.  Gastrointestinal: Negative for abdominal pain, nausea and vomiting.  Musculoskeletal: Positive for arthralgias (diffuse body aches). Negative for myalgias (diffuse body aches).  Skin: Negative for  color change and rash.  Allergic/Immunologic: Negative for environmental allergies.  Neurological: Positive for headaches. Negative for dizziness, weakness and light-headedness.  Hematological: Positive for adenopathy.     Physical Exam Triage Vital Signs ED Triage Vitals  Enc Vitals Group     BP 01/13/18 0947 116/83     Pulse Rate 01/13/18 0947 88     Resp 01/13/18 0947 18     Temp 01/13/18 0947 99.8 F (37.7 C)     Temp Source 01/13/18 0947 Oral     SpO2 01/13/18 0947 98 %     Weight 01/13/18 0943 175 lb (79.4 kg)     Height  01/13/18 0943 5\' 9"  (1.753 m)     Head Circumference --      Peak Flow --      Pain Score 01/13/18 0943 10     Pain Loc --      Pain Edu? --      Excl. in GC? --    No data found.  Updated Vital Signs BP 116/83 (BP Location: Left Arm)   Pulse 88   Temp 99.8 F (37.7 C) (Oral)   Resp 18   Ht 5\' 9"  (1.753 m)   Wt 175 lb (79.4 kg)   LMP 01/12/2018   SpO2 98%   BMI 25.84 kg/m        Physical Exam Vitals signs and nursing note reviewed.  Constitutional:      General: She is not in acute distress.    Appearance: She is well-developed and normal weight. She is ill-appearing.  HENT:     Head: Normocephalic and atraumatic.     Nose: Mucosal edema, congestion and rhinorrhea (moderate yellowish rhinorrhea bilat nares) present.     Right Turbinates: Swollen.     Left Turbinates: Swollen.     Left Sinus: Maxillary sinus tenderness present.     Mouth/Throat:     Mouth: Mucous membranes are moist.     Pharynx: Oropharynx is clear.  Eyes:     General: No scleral icterus.    Extraocular Movements: Extraocular movements intact.     Pupils: Pupils are equal, round, and reactive to light. Pupils are equal.  Neck:     Musculoskeletal: Neck supple.  Cardiovascular:     Rate and Rhythm: Normal rate and regular rhythm.     Heart sounds: No murmur.  Pulmonary:     Effort: Pulmonary effort is normal. No respiratory distress.     Breath sounds: Normal breath sounds. No wheezing, rhonchi or rales.  Lymphadenopathy:     Cervical: Cervical adenopathy (bilateral anterior) present.  Skin:    General: Skin is warm and dry.     Findings: No rash.  Neurological:     Mental Status: She is alert and oriented to person, place, and time. Mental status is at baseline.     Motor: No weakness.     Gait: Gait normal.  Psychiatric:        Mood and Affect: Mood normal.        Speech: Speech normal.        Behavior: Behavior normal.        Thought Content: Thought content normal.      UC  Treatments / Results  Labs (all labs ordered are listed, but only abnormal results are displayed) Labs Reviewed  RAPID INFLUENZA A&B ANTIGENS ([facility] ONLY) - Abnormal; Notable for the following components:      Result Value   Influenza B [facility]) POSITIVE (*)  All other components within normal limits    EKG None  Radiology No results found.  Procedures Procedures (including critical care time)  Medications Ordered in UC Medications  ketorolac (TORADOL) injection 60 mg (60 mg Intramuscular Given 01/13/18 1021)    Initial Impression / Assessment and Plan / UC Course  I have reviewed the triage vital signs and the nursing notes.  Pertinent labs & imaging results that were available during my care of the patient were reviewed by me and considered in my medical decision making (see chart for details).    Patient given ketorolac in clinic for headache with near complete relief of headache. Flu B positive. Since fever began about 48 hours ago, will begin Tamiflu. Begin Motrin for headaches and body aches. Advised rest and OTC Tylenol Cold/Sinus. F/u if symptoms worsen at all.   Final Clinical Impressions(s) / UC Diagnoses   Final diagnoses:  Influenza B  Acute nonintractable headache, unspecified headache type     Discharge Instructions     FLU: Stressed the need to increase rest and fluid/electrolyte intake . Use medications as directed including Tamiflu and OTC cough medications. If breathing becomes a concern follow back up with us or go to the ER. Avoid others and do not return to work/school too soon in order to prevent others from becoming infected. Most get better within 1-2 wks. Call PCP if trouble breathing or are short of breath, feel pain or pressure in your chest or abdomen, get dizzy, feel confused, or have vomiting .     ED Prescriptions    Medication Sig Dispense Auth. Provider   oseltamivir (TAMIFLU) 75 MG capsule Take 1 capsule (75 mg total) by mouth 2 (two)  times daily for 5 days. 10 capsule Eusebio FriendlyEaves, Lesley B, PA-C   ibuprofen (ADVIL,MOTRIN) 800 MG tablet Take 1 tablet (800 mg total) by mouth every 8 (eight) hours as needed for up to 7 days. 21 tablet Shirlee LatchEaves, Lesley B, PA-C     Controlled Substance Prescriptions [place] Controlled Substance Registry consulted? Not Applicable   Gareth Morganaves, Lesley B, PA-C 01/15/18 [id number]

## 2018-01-13 NOTE — ED Triage Notes (Signed)
Pt c/o headache, cough, sinus pain, pressure, nasal congestion and chills. Started about a week ago. Have a fever when it started but has not since.

## 2018-03-05 ENCOUNTER — Other Ambulatory Visit: Payer: Self-pay

## 2018-03-05 ENCOUNTER — Ambulatory Visit
Admission: EM | Admit: 2018-03-05 | Discharge: 2018-03-05 | Disposition: A | Discharge status: Home / Self Care | Payer: Medicaid Other | Attending: Family Medicine | Admitting: Family Medicine

## 2018-03-05 ENCOUNTER — Encounter: Payer: Self-pay | Admitting: Emergency Medicine

## 2018-03-05 DIAGNOSIS — B9789 Other viral agents as the cause of diseases classified elsewhere: Secondary | ICD-10-CM

## 2018-03-05 DIAGNOSIS — J038 Acute tonsillitis due to other specified organisms: Secondary | ICD-10-CM | POA: Diagnosis present

## 2018-03-05 LAB — RAPID STREP SCREEN (MED CTR MEBANE ONLY): STREPTOCOCCUS, GROUP A SCREEN (DIRECT): NEGATIVE

## 2018-03-05 LAB — RAPID INFLUENZA A&B ANTIGENS: Influenza A (ARMC): NEGATIVE

## 2018-03-05 LAB — RAPID INFLUENZA A&B ANTIGENS (ARMC ONLY): INFLUENZA B (ARMC): NEGATIVE

## 2018-03-05 MED ORDER — LIDOCAINE VISCOUS HCL 2 % MT SOLN: OROMUCOSAL | 0 refills | Status: DC

## 2018-03-05 MED ORDER — AMOXICILLIN 875 MG PO TABS: 875.0000 mg | ORAL_TABLET | Freq: Two times a day (BID) | ORAL | 0 refills | Status: DC

## 2018-03-05 NOTE — ED Triage Notes (Signed)
Pt c/o sore throat, chills, vomiting, and nausea. Started this morning. She also has pressure behind her eyes.

## 2018-03-05 NOTE — ED Provider Notes (Signed)
[facility]    CSN: [id number] Arrival date & time: 03/05/18  1302     History   Chief Complaint No chief complaint on file.   HPI Rose Baker is a 33 y.o. female.   The history is provided by the patient.  URI  Presenting symptoms: congestion, fever and sore throat   Severity:  Moderate Onset quality:  Sudden Duration:  8 hours Timing:  Constant Progression:  Worsening Chronicity:  New Relieved by:  None tried Ineffective treatments:  None tried Associated symptoms: no wheezing   Associated symptoms comment:  Nausea; vomited once today Risk factors: sick contacts   Risk factors: not elderly, no chronic cardiac disease, no chronic kidney disease, no chronic respiratory disease and no diabetes mellitus     History reviewed. No pertinent past medical history.  There are no active problems to display for this patient.   Past Surgical History:  Procedure Laterality Date  . CHOLECYSTECTOMY      OB History   No obstetric history on file.      Home Medications    Prior to Admission medications   Medication Sig Start Date End Date Taking? Authorizing Provider  amoxicillin (AMOXIL) 875 MG tablet Take 1 tablet (875 mg total) by mouth 2 (two) times daily. 03/05/18   Payton Mccallum, MD  dicyclomine (BENTYL) 20 MG tablet Take 1 tablet (20 mg total) by mouth 3 (three) times daily as needed (abdominal pain). 02/11/16   Phineas Semen, MD  dicyclomine (BENTYL) 20 MG tablet Take 1 tablet (20 mg total) by mouth 3 (three) times daily as needed (abdominal pain). 06/25/16   Phineas Semen, MD  lidocaine (XYLOCAINE) 2 % solution 20 ml gargle and spit q 6 hours prn 03/05/18   Payton Mccallum, MD  medroxyPROGESTERone (PROVERA) 10 MG tablet Take 2 tablets (20 mg total) by mouth daily. 08/21/17   Sharman Cheek, MD  ondansetron (ZOFRAN) 4 MG tablet Take 1 tablet (4 mg total) by mouth every 8 (eight) hours as needed for nausea or vomiting. 02/11/16   Phineas Semen, MD   ondansetron (ZOFRAN) 4 MG tablet Take 1 tablet (4 mg total) by mouth every 8 (eight) hours as needed for nausea or vomiting. 06/25/16   Phineas Semen, MD  predniSONE (DELTASONE) 50 MG tablet Take one tablet by mouth daily 10/19/14   Emily Filbert, MD    Family History Family History  Problem Relation Age of Onset  . Healthy Mother   . Diabetes Father     Social History Social History   Tobacco Use  . Smoking status: Current Some Day Smoker    Types: Cigarettes  . Smokeless tobacco: Never Used  Substance Use Topics  . Alcohol use: Not Currently  . Drug use: No     Allergies   Patient has no known allergies.   Review of Systems Review of Systems  Constitutional: Positive for fever.  HENT: Positive for congestion and sore throat.   Respiratory: Negative for wheezing.      Physical Exam Triage Vital Signs ED Triage Vitals  Enc Vitals Group     BP 03/05/18 1338 124/83     Pulse Rate 03/05/18 1338 92     Resp 03/05/18 1338 18     Temp 03/05/18 1338 (!) 100.6 F (38.1 C)     Temp Source 03/05/18 1338 Oral     SpO2 03/05/18 1338 99 %     Weight 03/05/18 1334 175 lb (79.4 kg)  Height 03/05/18 1334 5\' 9"  (1.753 m)     Head Circumference --      Peak Flow --      Pain Score 03/05/18 1334 9     Pain Loc --      Pain Edu? --      Excl. in GC? --    No data found.  Updated Vital Signs BP 124/83 (BP Location: Left Arm)   Pulse 92   Temp (!) 100.6 F (38.1 C) (Oral)   Resp 18   Ht 5\' 9"  (1.753 m)   Wt 79.4 kg   LMP 02/20/2018 (Approximate)   SpO2 99%   BMI 25.84 kg/m   Visual Acuity Right Eye Distance:   Left Eye Distance:   Bilateral Distance:    Right Eye Near:   Left Eye Near:    Bilateral Near:     Physical Exam Vitals signs and nursing note reviewed.  Constitutional:      General: She is not in acute distress.    Appearance: She is not toxic-appearing or diaphoretic.  HENT:     Right Ear: Tympanic membrane normal.     Left Ear:  Tympanic membrane normal.     Nose: Congestion and rhinorrhea present.     Mouth/Throat:     Pharynx: Oropharyngeal exudate and posterior oropharyngeal erythema present.     Tonsils: Tonsillar exudate present.  Cardiovascular:     Rate and Rhythm: Normal rate and regular rhythm.     Heart sounds: Normal heart sounds.  Pulmonary:     Effort: Pulmonary effort is normal. No respiratory distress.     Breath sounds: Normal breath sounds. No stridor. No wheezing, rhonchi or rales.  Neurological:     Mental Status: She is alert.      UC Treatments / Results  Labs (all labs ordered are listed, but only abnormal results are displayed) Labs Reviewed  RAPID STREP SCREEN ([facility] ONLY)  RAPID INFLUENZA A&B ANTIGENS ([facility] ONLY)  CULTURE, GROUP A STREP [facility])    EKG None  Radiology No results found.  Procedures Procedures (including critical care time)  Medications Ordered in UC Medications - No data to display  Initial Impression / Assessment and Plan / UC Course  I have reviewed the triage vital signs and the nursing notes.  Pertinent labs & imaging results that were available during my care of the patient were reviewed by me and considered in my medical decision making (see chart for details).      Final Clinical Impressions(s) / UC Diagnoses   Final diagnoses:  Acute tonsillitis due to other specified organisms    ED Prescriptions    Medication Sig Dispense Auth. Provider   amoxicillin (AMOXIL) 875 MG tablet Take 1 tablet (875 mg total) by mouth 2 (two) times daily. 20 tablet Daneil Beem, Pamala Hurry, MD   lidocaine (XYLOCAINE) 2 % solution 20 ml gargle and spit q 6 hours prn 100 mL Payton Mccallum, MD     1. Lab results and diagnosis reviewed with patient  2. rx as per orders above; reviewed possible side effects, interactions, risks and benefits  3. Recommend supportive treatment with otc analgesics prn  4. Follow-up prn if symptoms worsen or don't  improve   Controlled Substance Prescriptions [place] Controlled Substance Registry consulted? Not Applicable   Payton Mccallum, MD 03/05/18 [id number]

## 2018-03-05 NOTE — Discharge Instructions (Signed)
Tylenol/advil as needed.

## 2018-03-09 LAB — CULTURE, GROUP A STREP (THRC)

## 2018-07-30 ENCOUNTER — Other Ambulatory Visit: Payer: Self-pay | Admitting: Family

## 2018-07-30 DIAGNOSIS — N390 Urinary tract infection, site not specified: Secondary | ICD-10-CM

## 2018-08-06 ENCOUNTER — Other Ambulatory Visit: Payer: Self-pay

## 2018-08-06 ENCOUNTER — Ambulatory Visit
Admission: RE | Admit: 2018-08-06 | Discharge: 2018-08-06 | Disposition: A | Discharge status: Home / Self Care | Payer: Medicaid Other | Source: Ambulatory Visit | Attending: Family | Admitting: Family

## 2018-08-06 DIAGNOSIS — N390 Urinary tract infection, site not specified: Secondary | ICD-10-CM | POA: Diagnosis present

## 2018-08-11 ENCOUNTER — Inpatient Hospital Stay: Payer: Medicaid Other | Attending: Oncology | Admitting: Oncology

## 2018-08-11 ENCOUNTER — Encounter: Payer: Self-pay | Admitting: Oncology

## 2018-08-11 ENCOUNTER — Other Ambulatory Visit: Payer: Self-pay

## 2018-08-11 VITALS — BP 115/70 | HR 69 | Temp 98.1°F | Ht 69.0 in | Wt 198.2 lb

## 2018-08-11 DIAGNOSIS — Z87442 Personal history of urinary calculi: Secondary | ICD-10-CM | POA: Diagnosis not present

## 2018-08-11 DIAGNOSIS — N926 Irregular menstruation, unspecified: Secondary | ICD-10-CM

## 2018-08-11 DIAGNOSIS — F17210 Nicotine dependence, cigarettes, uncomplicated: Secondary | ICD-10-CM | POA: Diagnosis not present

## 2018-08-11 DIAGNOSIS — D500 Iron deficiency anemia secondary to blood loss (chronic): Secondary | ICD-10-CM | POA: Diagnosis present

## 2018-08-11 DIAGNOSIS — N920 Excessive and frequent menstruation with regular cycle: Secondary | ICD-10-CM | POA: Diagnosis not present

## 2018-08-11 DIAGNOSIS — D649 Anemia, unspecified: Secondary | ICD-10-CM

## 2018-08-11 DIAGNOSIS — R5383 Other fatigue: Secondary | ICD-10-CM | POA: Diagnosis not present

## 2018-08-11 NOTE — Progress Notes (Signed)
Patient here today for new patient consult.   

## 2018-08-11 NOTE — Progress Notes (Signed)
Hematology/Oncology Consult note [facility] [phone] [phone]   Patient Care Team: Margaretann LovelessKhan, Neelam S, MD as PCP - General (Internal Medicine)  REFERRING PROVIDER: Margaretann LovelessKhan, Neelam S, MD CHIEF COMPLAINTS/REASON FOR VISIT:  Evaluation of iron deficiency anemia  HISTORY OF PRESENTING ILLNESS:  Rose Baker is a  33 y.o.  female with PMH listed below was seen in consultation at the request of Margaretann LovelessKhan, Neelam S, MD   for evaluation of iron deficiency anemia.   Reviewed patient's recent labs  Labs revealed anemia with hemoglobin of . 01/21/2018 hemoglobin 10.8, MCV 86, platelet 255, WBC 6.6, 01/21/2018 iron panel showed TIBC 392, iron 26, iron saturation 7, ferritin 7 Folic acid 11 TSH 0.916 Patient was recommended to start oral iron supplementation earlier this year. Patient was also recently seen again by Dr. Welton FlakesKhan recently with reevaluation. She had blood work done.  We obtained lab records from Dr. Shanda BumpsKohn's office. 07/24/2018, CBC showed hemoglobin 10.9, MCV 79, WBC 8.5, platelet count 344,000 07/24/2018 iron panel was repeated.  TIBC 398, iron 55, iron saturation 14. CMP nonremarkable. Patient also had alpha thalassemia DNA analysis which is negative.  Hemoglobin solubility negative.   Associated signs and symptoms: Patient reports fatigue.  Denies SOB with exertion.  Denies weight loss, easy bruising, hematochezia, hemoptysis, hematuria. Context:  History of iron deficiency: Started iron tablets 6 months ago. Rectal bleeding: Denies Menstrual bleeding/ Vaginal bleeding : Patient reports a history of OCP use 3 years ago.  Has been off OCP since then, she has noticed that  since last October, she has had irregular menstrual periods, flow has been heavy for a few days, also sometimes last 2 weeks. Hematemesis or hemoptysis : denies Blood in urine : denies  Last endoscopy: Not available Fatigue: Yes.  SOB: deneis    Review of Systems   Constitutional: Positive for fatigue. Negative for appetite change, chills and fever.  HENT:   Negative for hearing loss and voice change.   Eyes: Negative for eye problems.  Respiratory: Negative for chest tightness and cough.   Cardiovascular: Negative for chest pain.  Gastrointestinal: Negative for abdominal distention, abdominal pain and blood in stool.  Endocrine: Negative for hot flashes.  Genitourinary: Negative for difficulty urinating and frequency.   Musculoskeletal: Negative for arthralgias.  Skin: Negative for itching and rash.  Neurological: Negative for extremity weakness.  Hematological: Negative for adenopathy.  Psychiatric/Behavioral: Negative for confusion.    MEDICAL HISTORY:  History reviewed. No pertinent past medical history.  SURGICAL HISTORY: Past Surgical History:  Procedure Laterality Date  . CHOLECYSTECTOMY      SOCIAL HISTORY: Social History   Socioeconomic History  . Marital status: Single    Spouse name: Not on file  . Number of children: Not on file  . Years of education: Not on file  . Highest education level: Not on file  Occupational History  . Not on file  Social Needs  . Financial resource strain: Not on file  . Food insecurity    Worry: Not on file    Inability: Not on file  . Transportation needs    Medical: Not on file    Non-medical: Not on file  Tobacco Use  . Smoking status: Current Some Day Smoker    Types: Cigarettes  . Smokeless tobacco: Never Used  Substance and Sexual Activity  . Alcohol use: Not Currently  . Drug use: No  . Sexual activity: Yes  Lifestyle  . Physical activity    Days  per week: Not on file    Minutes per session: Not on file  . Stress: Not on file  Relationships  . Social Musicianconnections    Talks on phone: Not on file    Gets together: Not on file    Attends religious service: Not on file    Active member of club or organization: Not on file    Attends meetings of clubs or organizations: Not on  file    Relationship status: Not on file  . Intimate partner violence    Fear of current or ex partner: Not on file    Emotionally abused: Not on file    Physically abused: Not on file    Forced sexual activity: Not on file  Other Topics Concern  . Not on file  Social History Narrative  . Not on file    FAMILY HISTORY: Family History  Problem Relation Age of Onset  . Healthy Mother   . Diabetes Father     ALLERGIES:  has No Known Allergies.  MEDICATIONS:  Current Outpatient Medications  Medication Sig Dispense Refill  . dicyclomine (BENTYL) 20 MG tablet Take 1 tablet (20 mg total) by mouth 3 (three) times daily as needed (abdominal pain). 30 tablet 0   No current facility-administered medications for this visit.      PHYSICAL EXAMINATION: ECOG PERFORMANCE STATUS: 1 - Symptomatic but completely ambulatory Vitals:   08/11/18 1526  BP: 115/70  Pulse: 69  Temp: 98.1 F (36.7 C)   Filed Weights   08/11/18 1526  Weight: 198 lb 3 oz (89.9 kg)    Physical Exam Constitutional:      General: She is not in acute distress.    Appearance: She is obese.  HENT:     Head: Normocephalic and atraumatic.  Eyes:     General: No scleral icterus.    Pupils: Pupils are equal, round, and reactive to light.  Neck:     Musculoskeletal: Normal range of motion and neck supple.  Cardiovascular:     Rate and Rhythm: Normal rate and regular rhythm.     Heart sounds: Normal heart sounds.  Pulmonary:     Effort: Pulmonary effort is normal. No respiratory distress.     Breath sounds: No wheezing.  Abdominal:     General: Bowel sounds are normal. There is no distension.     Palpations: Abdomen is soft. There is no mass.     Tenderness: There is no abdominal tenderness.  Musculoskeletal: Normal range of motion.        General: No deformity.  Skin:    General: Skin is warm and dry.     Findings: No erythema or rash.  Neurological:     Mental Status: She is alert and oriented to  person, place, and time.     Cranial Nerves: No cranial nerve deficit.     Coordination: Coordination normal.  Psychiatric:        Behavior: Behavior normal.        Thought Content: Thought content normal.       CMP Latest Ref Rng & Units 08/21/2017  Glucose 70 - 99 mg/dL 99  BUN 6 - 20 mg/dL 13  Creatinine 1.610.44 - 0.961.00 mg/dL 0.450.82  Sodium 409135 - 811145 mmol/L 139  Potassium 3.5 - 5.1 mmol/L 3.9  Chloride 98 - 111 mmol/L 105  CO2 22 - 32 mmol/L 27  Calcium 8.9 - 10.3 mg/dL 9.2  Total Protein 6.5 - 8.1 g/dL 7.9  Total  Bilirubin 0.3 - 1.2 mg/dL 0.9(W1.3(H)  Alkaline Phos 38 - 126 U/L 56  AST 15 - 41 U/L 17  ALT 0 - 44 U/L 12   CBC Latest Ref Rng & Units 08/21/2017  WBC 3.6 - 11.0 K/uL 10.6  Hemoglobin 12.0 - 16.0 g/dL 11.912.9  Hematocrit 14.735.0 - 47.0 % 38.3  Platelets 150 - 440 K/uL 294     LABORATORY DATA:  I have reviewed the data as listed Lab Results  Component Value Date   WBC 10.6 08/21/2017   HGB 12.9 08/21/2017   HCT 38.3 08/21/2017   MCV 90.0 08/21/2017   PLT 294 08/21/2017   Recent Labs    08/21/17 1316  NA 139  K 3.9  CL 105  CO2 27  GLUCOSE 99  BUN 13  CREATININE 0.82  CALCIUM 9.2  GFRNONAA >60  GFRAA >60  PROT 7.9  ALBUMIN 4.5  AST 17  ALT 12  ALKPHOS 56  BILITOT 1.3*   Iron/TIBC/Ferritin/ %Sat No results found for: IRON, TIBC, FERRITIN, IRONPCTSAT   RADIOGRAPHIC STUDIES: I have personally reviewed the radiological images as listed and agreed with the findings in the report.  [place] Renal  Result Date: 08/06/2018 CLINICAL DATA:  33 year old female with frequent UTIs. EXAM: RENAL / URINARY TRACT ULTRASOUND COMPLETE COMPARISON:  Abdominal ultrasound dated 07/12/2013 FINDINGS: Right Kidney: Renal measurements: 11.8 x 4.1 x 5.1 cm = volume: 129 mL . Echogenicity within normal limits. No mass or hydronephrosis visualized. Left Kidney: Renal measurements: 12.4 x 4.7 x 6.1 cm = volume: 184 mL. Echogenicity within normal limits. No mass or hydronephrosis  visualized. Bladder: Appears normal for degree of bladder distention. Bilateral ureteral jets noted. Probable fatty infiltration of the liver. IMPRESSION: Unremarkable renal ultrasound. Electronically Signed   By: Elgie CollardArash  Radparvar M.D.   On: 08/06/2018 21:42      ASSESSMENT & PLAN:  1. Iron deficiency anemia due to chronic blood loss   2. Irregular menstrual bleeding   We obtained patient's recent lab records from Dr. Milta DeitersKhan's office.  Labs are reviewed. Labs are reviewed and discussed with patient. Consistent with iron deficiency anemia. Plan IV iron with Feraheme 510mg  weekly x 2. Allergy reactions/infusion reaction including anaphylactic reaction discussed with patient. Other side effects include but not limited to high blood pressure, skin rash, weight gain, leg swelling, etc. Patient voices understanding and willing to proceed.  Recommend patient to establish care with GYN for further evaluation of her irregular menstrual bleeding.  Orders Placed This Encounter  Procedures  . CBC with Differential/Platelet    Standing Status:   Future    Number of Occurrences:   1    Standing Expiration Date:   08/11/2019  . CBC with Differential/Platelet    Standing Status:   Future    Standing Expiration Date:   08/11/2019  . Ferritin    Standing Status:   Future    Standing Expiration Date:   08/11/2019  . Iron and TIBC    Standing Status:   Future    Standing Expiration Date:   08/11/2019    All questions were answered. The patient knows to call the clinic with any problems questions or concerns.  Cc Margaretann LovelessKhan, Neelam S, MD  Return of visit: 6 weeks Thank you for this kind referral and the opportunity to participate in the care of this patient. A copy of today's note is routed to referring provider  Total face to face encounter time for this patient visit was 45min. >50% of  the time was  spent in counseling and coordination of care.    Earlie Server, MD, PhD Hematology Oncology [facility] Pager- [phone] 08/11/2018

## 2018-08-12 ENCOUNTER — Encounter: Payer: Self-pay | Admitting: Oncology

## 2018-08-12 DIAGNOSIS — D500 Iron deficiency anemia secondary to blood loss (chronic): Secondary | ICD-10-CM

## 2018-08-12 HISTORY — DX: Iron deficiency anemia secondary to blood loss (chronic): D50.0

## 2018-08-12 NOTE — Addendum Note (Signed)
Addended by: Earlie Server on: 08/12/2018 04:34 PM   Modules accepted: Orders

## 2018-08-19 ENCOUNTER — Other Ambulatory Visit: Payer: Self-pay

## 2018-08-20 ENCOUNTER — Inpatient Hospital Stay: Payer: Medicaid Other | Attending: Oncology

## 2018-08-20 ENCOUNTER — Other Ambulatory Visit: Payer: Self-pay

## 2018-08-20 VITALS — BP 108/73 | HR 75 | Temp 97.1°F | Resp 20

## 2018-08-20 DIAGNOSIS — D500 Iron deficiency anemia secondary to blood loss (chronic): Secondary | ICD-10-CM

## 2018-08-20 DIAGNOSIS — Z79899 Other long term (current) drug therapy: Secondary | ICD-10-CM | POA: Diagnosis not present

## 2018-08-20 MED ORDER — SODIUM CHLORIDE 0.9 % IV SOLN
Freq: Once | INTRAVENOUS | Status: AC
  Administered 2018-08-20: 14:00:00 via INTRAVENOUS
  Filled 2018-08-20: qty 250

## 2018-08-20 MED ORDER — SODIUM CHLORIDE 0.9 % IV SOLN
510.0000 mg | Freq: Once | INTRAVENOUS | Status: AC
  Administered 2018-08-20: 510 mg via INTRAVENOUS
  Filled 2018-08-20: qty 17

## 2018-08-26 ENCOUNTER — Other Ambulatory Visit: Payer: Self-pay

## 2018-08-27 ENCOUNTER — Inpatient Hospital Stay: Payer: Medicaid Other

## 2018-08-27 ENCOUNTER — Other Ambulatory Visit: Payer: Self-pay

## 2018-08-27 VITALS — BP 100/67 | HR 69 | Temp 97.9°F | Resp 20

## 2018-08-27 DIAGNOSIS — D500 Iron deficiency anemia secondary to blood loss (chronic): Secondary | ICD-10-CM | POA: Diagnosis not present

## 2018-08-27 MED ORDER — SODIUM CHLORIDE 0.9 % IV SOLN
510.0000 mg | Freq: Once | INTRAVENOUS | Status: AC
  Administered 2018-08-27: 510 mg via INTRAVENOUS
  Filled 2018-08-27: qty 17

## 2018-08-27 MED ORDER — SODIUM CHLORIDE 0.9 % IV SOLN
Freq: Once | INTRAVENOUS | Status: AC
  Administered 2018-08-27: 14:00:00 via INTRAVENOUS
  Filled 2018-08-27: qty 250

## 2018-08-28 ENCOUNTER — Other Ambulatory Visit: Payer: Self-pay

## 2018-08-28 DIAGNOSIS — Z20822 Contact with and (suspected) exposure to covid-19: Secondary | ICD-10-CM

## 2018-08-30 LAB — NOVEL CORONAVIRUS, NAA: SARS-CoV-2, NAA: NOT DETECTED

## 2018-09-12 ENCOUNTER — Other Ambulatory Visit: Payer: Self-pay

## 2018-09-15 ENCOUNTER — Other Ambulatory Visit: Payer: Self-pay

## 2018-09-15 ENCOUNTER — Inpatient Hospital Stay: Payer: Medicaid Other

## 2018-09-15 DIAGNOSIS — D500 Iron deficiency anemia secondary to blood loss (chronic): Secondary | ICD-10-CM

## 2018-09-15 LAB — CBC WITH DIFFERENTIAL/PLATELET
Abs Immature Granulocytes: 0.02 10*3/uL (ref 0.00–0.07)
Basophils Absolute: 0 10*3/uL (ref 0.0–0.1)
Basophils Relative: 1 %
Eosinophils Absolute: 0.1 10*3/uL (ref 0.0–0.5)
Eosinophils Relative: 2 %
HCT: 37.2 % (ref 36.0–46.0)
Hemoglobin: 12 g/dL (ref 12.0–15.0)
Immature Granulocytes: 0 %
Lymphocytes Relative: 35 %
Lymphs Abs: 2 10*3/uL (ref 0.7–4.0)
MCH: 27.3 pg (ref 26.0–34.0)
MCHC: 32.3 g/dL (ref 30.0–36.0)
MCV: 84.5 fL (ref 80.0–100.0)
Monocytes Absolute: 0.3 10*3/uL (ref 0.1–1.0)
Monocytes Relative: 5 %
Neutro Abs: 3.2 10*3/uL (ref 1.7–7.7)
Neutrophils Relative %: 57 %
Platelets: 244 10*3/uL (ref 150–400)
RBC: 4.4 MIL/uL (ref 3.87–5.11)
RDW: 18.3 % — ABNORMAL HIGH (ref 11.5–15.5)
WBC: 5.6 10*3/uL (ref 4.0–10.5)
nRBC: 0 % (ref 0.0–0.2)

## 2018-09-15 LAB — FERRITIN: Ferritin: 103 ng/mL (ref 11–307)

## 2018-09-15 LAB — IRON AND TIBC
Iron: 48 ug/dL (ref 28–170)
Saturation Ratios: 16 % (ref 10.4–31.8)
TIBC: 301 ug/dL (ref 250–450)
UIBC: 253 ug/dL

## 2018-09-16 NOTE — Progress Notes (Signed)
Patient is coming for follow up and feraheme, she is doing well no complaints and screening has been done, no COVID -19 symptoms and is aware of the no visitor policy.

## 2018-09-17 ENCOUNTER — Inpatient Hospital Stay: Payer: Medicaid Other

## 2018-09-17 ENCOUNTER — Other Ambulatory Visit: Payer: Self-pay

## 2018-09-17 ENCOUNTER — Encounter: Payer: Self-pay | Admitting: Oncology

## 2018-09-17 ENCOUNTER — Inpatient Hospital Stay: Payer: Medicaid Other | Attending: Oncology | Admitting: Oncology

## 2018-09-17 VITALS — BP 104/68 | HR 66 | Temp 97.8°F | Wt 197.4 lb

## 2018-09-17 DIAGNOSIS — F418 Other specified anxiety disorders: Secondary | ICD-10-CM | POA: Insufficient documentation

## 2018-09-17 DIAGNOSIS — D500 Iron deficiency anemia secondary to blood loss (chronic): Secondary | ICD-10-CM | POA: Diagnosis not present

## 2018-09-17 DIAGNOSIS — Z800 Family history of malignant neoplasm of digestive organs: Secondary | ICD-10-CM | POA: Diagnosis not present

## 2018-09-17 DIAGNOSIS — N926 Irregular menstruation, unspecified: Secondary | ICD-10-CM | POA: Insufficient documentation

## 2018-09-17 DIAGNOSIS — Z803 Family history of malignant neoplasm of breast: Secondary | ICD-10-CM | POA: Insufficient documentation

## 2018-09-17 DIAGNOSIS — Z806 Family history of leukemia: Secondary | ICD-10-CM | POA: Insufficient documentation

## 2018-09-17 NOTE — Progress Notes (Signed)
Patient here today for follow up and feraheme.  Patient denies SOB or fatigue. Patient states no new concerns to

## 2018-09-17 NOTE — Progress Notes (Signed)
Hematology/Oncology follow up note [facility] [phone] Fax:(336) [phone]   Patient Care Team: Margaretann Loveless, MD as PCP - General (Internal Medicine)  REFERRING PROVIDER: Margaretann Loveless, MD CHIEF COMPLAINTS/REASON FOR VISIT:  Evaluation of iron deficiency anemia  HISTORY OF PRESENTING ILLNESS:  Rose Baker is a  33 y.o.  female with PMH listed below was seen in consultation at the request of Margaretann Loveless, MD   for evaluation of iron deficiency anemia.   Reviewed patient's recent labs  Labs revealed anemia with hemoglobin of . 01/21/2018 hemoglobin 10.8, MCV 86, platelet 255, WBC 6.6, 01/21/2018 iron panel showed TIBC 392, iron 26, iron saturation 7, ferritin 7 Folic acid 11 TSH 0.916 Patient was recommended to start oral iron supplementation earlier this year. Patient was also recently seen again by Dr. Welton Flakes recently with reevaluation. She had blood work done.  We obtained lab records from Dr. Shanda Bumps office. 07/24/2018, CBC showed hemoglobin 10.9, MCV 79, WBC 8.5, platelet count 344,000 07/24/2018 iron panel was repeated.  TIBC 398, iron 55, iron saturation 14. CMP nonremarkable. Patient also had alpha thalassemia DNA analysis which is negative.  Hemoglobin solubility negative.   Associated signs and symptoms: Patient reports fatigue.  Denies SOB with exertion.  Denies weight loss, easy bruising, hematochezia, hemoptysis, hematuria. Context:  History of iron deficiency: Started iron tablets 6 months ago. Rectal bleeding: Denies Menstrual bleeding/ Vaginal bleeding : Patient reports a history of OCP use 3 years ago.  Has been off OCP since then, she has noticed that  since last October, she has had irregular menstrual periods, flow has been heavy for a few days, also sometimes last 2 weeks. Hematemesis or hemoptysis : denies Blood in urine : denies  Last endoscopy: Not available Fatigue: Yes.  SOB: deneis  INTERVAL HISTORY Rose Baker is a 33 y.o. female who has above history reviewed by me today presents for follow up visit for management of iron deficiency anemia. Problems and complaints are listed below: Patient has received IV Feraheme weekly x2, reports fatigue level has significantly improved.  Continues to have irregular menstrual cycles.  Review of Systems  Constitutional: Negative for appetite change, chills, fatigue and fever.  HENT:   Negative for hearing loss and voice change.   Eyes: Negative for eye problems.  Respiratory: Negative for chest tightness and cough.   Cardiovascular: Negative for chest pain.  Gastrointestinal: Negative for abdominal distention, abdominal pain and blood in stool.  Endocrine: Negative for hot flashes.  Genitourinary: Negative for difficulty urinating and frequency.   Musculoskeletal: Negative for arthralgias.  Skin: Negative for itching and rash.  Neurological: Negative for extremity weakness.  Hematological: Negative for adenopathy.  Psychiatric/Behavioral: Negative for confusion.    MEDICAL HISTORY:  Past Medical History:  Diagnosis Date  . Anxiety   . Depression   . Iron deficiency anemia due to chronic blood loss 08/12/2018    SURGICAL HISTORY: Past Surgical History:  Procedure Laterality Date  . CHOLECYSTECTOMY    . WISDOM TOOTH EXTRACTION      SOCIAL HISTORY: Social History   Socioeconomic History  . Marital status: Single    Spouse name: Not on file  . Number of children: Not on file  . Years of education: Not on file  . Highest education level: Not on file  Occupational History  . Not on file  Social Needs  . Financial resource strain: Not on file  . Food insecurity    Worry: Not  on file    Inability: Not on file  . Transportation needs    Medical: Not on file    Non-medical: Not on file  Tobacco Use  . Smoking status: Current Some Day Smoker    Packs/day: 0.25    Types: Cigarettes  . Smokeless tobacco: Never Used  . Tobacco comment:  1-2 cigarettes a week   Substance and Sexual Activity  . Alcohol use: Yes    Alcohol/week: 2.0 standard drinks    Types: 2 Glasses of wine per week  . Drug use: No  . Sexual activity: Yes  Lifestyle  . Physical activity    Days per week: Not on file    Minutes per session: Not on file  . Stress: Not on file  Relationships  . Social Herbalist on phone: Not on file    Gets together: Not on file    Attends religious service: Not on file    Active member of club or organization: Not on file    Attends meetings of clubs or organizations: Not on file    Relationship status: Not on file  . Intimate partner violence    Fear of current or ex partner: Not on file    Emotionally abused: Not on file    Physically abused: Not on file    Forced sexual activity: Not on file  Other Topics Concern  . Not on file  Social History Narrative  . Not on file    FAMILY HISTORY: Family History  Problem Relation Age of Onset  . Healthy Mother   . Diabetes Father   . Leukemia Maternal Aunt   . Colon cancer Maternal Grandmother   . Breast cancer Maternal Grandmother     ALLERGIES:  has No Known Allergies.  MEDICATIONS:  Current Outpatient Medications  Medication Sig Dispense Refill  . dicyclomine (BENTYL) 20 MG tablet Take 1 tablet (20 mg total) by mouth 3 (three) times daily as needed (abdominal pain). 30 tablet 0   No current facility-administered medications for this visit.      PHYSICAL EXAMINATION: ECOG PERFORMANCE STATUS: 1 - Symptomatic but completely ambulatory Vitals:   09/17/18 1306  BP: 104/68  Pulse: 66  Temp: 97.8 F (36.6 C)   Filed Weights   09/17/18 1306  Weight: 197 lb 7 oz (89.6 kg)    Physical Exam Constitutional:      General: She is not in acute distress.    Appearance: She is obese.  HENT:     Head: Normocephalic and atraumatic.  Eyes:     General: No scleral icterus.    Pupils: Pupils are equal, round, and reactive to light.  Neck:      Musculoskeletal: Normal range of motion and neck supple.  Cardiovascular:     Rate and Rhythm: Normal rate and regular rhythm.     Heart sounds: Normal heart sounds.  Pulmonary:     Effort: Pulmonary effort is normal. No respiratory distress.     Breath sounds: No wheezing.  Abdominal:     General: Bowel sounds are normal. There is no distension.     Palpations: Abdomen is soft. There is no mass.     Tenderness: There is no abdominal tenderness.  Musculoskeletal: Normal range of motion.        General: No deformity.  Skin:    General: Skin is warm and dry.     Findings: No erythema or rash.  Neurological:     Mental Status:  She is alert and oriented to person, place, and time.     Cranial Nerves: No cranial nerve deficit.     Coordination: Coordination normal.  Psychiatric:        Behavior: Behavior normal.        Thought Content: Thought content normal.       CMP Latest Ref Rng & Units 08/21/2017  Glucose 70 - 99 mg/dL 99  BUN 6 - 20 mg/dL 13  Creatinine 4.090.44 - 8.111.00 mg/dL 9.140.82  Sodium 782135 - 956145 mmol/L 139  Potassium 3.5 - 5.1 mmol/L 3.9  Chloride 98 - 111 mmol/L 105  CO2 22 - 32 mmol/L 27  Calcium 8.9 - 10.3 mg/dL 9.2  Total Protein 6.5 - 8.1 g/dL 7.9  Total Bilirubin 0.3 - 1.2 mg/dL 2.1(H1.3(H)  Alkaline Phos 38 - 126 U/L 56  AST 15 - 41 U/L 17  ALT 0 - 44 U/L 12   CBC Latest Ref Rng & Units 09/15/2018  WBC 4.0 - 10.5 K/uL 5.6  Hemoglobin 12.0 - 15.0 g/dL 08.612.0  Hematocrit 57.836.0 - 46.0 % 37.2  Platelets 150 - 400 K/uL 244     LABORATORY DATA:  I have reviewed the data as listed Lab Results  Component Value Date   WBC 5.6 09/15/2018   HGB 12.0 09/15/2018   HCT 37.2 09/15/2018   MCV 84.5 09/15/2018   PLT 244 09/15/2018   No results for input(s): NA, K, CL, CO2, GLUCOSE, BUN, CREATININE, CALCIUM, GFRNONAA, GFRAA, PROT, ALBUMIN, AST, ALT, ALKPHOS, BILITOT, BILIDIR, IBILI in the last 8760 hours. Iron/TIBC/Ferritin/ %Sat    Component Value Date/Time   IRON 48  09/15/2018 1132   TIBC 301 09/15/2018 1132   FERRITIN 103 09/15/2018 1132   IRONPCTSAT 16 09/15/2018 1132     RADIOGRAPHIC STUDIES: I have personally reviewed the radiological images as listed and agreed with the findings in the report.  No results found.    ASSESSMENT & PLAN:  1. Iron deficiency anemia due to chronic blood loss   2. Irregular menstrual bleeding   Status post IV Feraheme weekly x2. She is clinically improving. Labs reviewed and discussed with patient. Hemoglobin normalized.  Iron store has also improved. No need for additional IV iron at this point. I recommend patient to resume daily ferrous sulfate oral iron supplementation for maintenance.  She has ongoing irregular menstrual bleeding.  Discussed with patient recommend patient to establish care with gynecologist..   Orders Placed This Encounter  Procedures  . CBC with Differential/Platelet    Standing Status:   Future    Standing Expiration Date:   09/17/2019  . Ferritin    Standing Status:   Future    Standing Expiration Date:   09/17/2019  . Iron and TIBC    Standing Status:   Future    Standing Expiration Date:   09/17/2019    All questions were answered. The patient knows to call the clinic with any problems questions or concerns.  Cc Margaretann LovelessKhan, Neelam S, MD  Return of visit: 6 months   Rickard PatienceZhou Leander Tout, MD, PhD Hematology Oncology [facility] Pager- [phone] 09/17/2018

## 2018-12-25 ENCOUNTER — Other Ambulatory Visit: Payer: Self-pay

## 2018-12-25 ENCOUNTER — Ambulatory Visit
Admission: EM | Admit: 2018-12-25 | Discharge: 2018-12-25 | Disposition: A | Discharge status: Home / Self Care | Payer: Medicaid Other | Attending: Emergency Medicine | Admitting: Emergency Medicine

## 2018-12-25 DIAGNOSIS — J039 Acute tonsillitis, unspecified: Secondary | ICD-10-CM | POA: Insufficient documentation

## 2018-12-25 LAB — RAPID STREP SCREEN (MED CTR MEBANE ONLY): Streptococcus, Group A Screen (Direct): NEGATIVE

## 2018-12-25 MED ORDER — DEXAMETHASONE SODIUM PHOSPHATE 10 MG/ML IJ SOLN
10.0000 mg | Freq: Once | INTRAMUSCULAR | Status: AC
  Administered 2018-12-25: 12:00:00 10 mg via INTRAMUSCULAR

## 2018-12-25 MED ORDER — PREDNISONE 20 MG PO TABS: 40.0000 mg | ORAL_TABLET | Freq: Every day | ORAL | 0 refills | Status: DC

## 2018-12-25 MED ORDER — LIDOCAINE VISCOUS HCL 2 % MT SOLN
15.0000 mL | Freq: Once | OROMUCOSAL | Status: AC
  Administered 2018-12-25: 15 mL via OROMUCOSAL

## 2018-12-25 MED ORDER — AMOXICILLIN 875 MG PO TABS: 875.0000 mg | ORAL_TABLET | Freq: Two times a day (BID) | ORAL | 0 refills | Status: AC

## 2018-12-25 MED ORDER — LIDOCAINE VISCOUS HCL 2 % MT SOLN: 10.0000 mL | OROMUCOSAL | 0 refills | Status: DC | PRN

## 2018-12-25 NOTE — ED Triage Notes (Signed)
Patient complains of sore throat that started yesterday morning. Patient states that she woke up this morning with right side neck swelling and right ear pain.

## 2018-12-25 NOTE — ED Provider Notes (Signed)
[place], [place]   Name: Rose Baker DOB: 1985/04/23 MRN: [id number] CSN: [id number] PCP: Perrin Maltese, MD  Arrival date and time:  12/25/18 1056  Chief Complaint:  Sore Throat   NOTE: Prior to seeing the patient today, I have reviewed the triage nursing documentation and vital signs. Clinical staff has updated patient's PMH/PSHx, current medication list, and drug allergies/intolerances to ensure comprehensive history available to assist in medical decision making.   History:   HPI: Rose Baker is a 33 y.o. female who presents today with complaints of sore throat that started with acute onset yesterday. She woke this morning with an acute exacerbation of her symptoms. She has unilateral tonsillar swelling, pain in the RIGHT side of her neck, and pain in her RIGHT ear. She denies any associated fevers, however reports that she has had elevated temperatures as high as 99.8. Patient denies dysphagia; able to handle oral secretions and thin liquids. She denies shortness of breath, wheezing, or stridor. PMH significant for recurrent streptococcal pharyngitis. Patient advising that the have mentioned her having her tonsils removed in the past, however things have not progressed beyond an in office discussion with her PCP. Despite her symptoms, patient has not taken any over the counter interventions to help improve/relieve her reported symptoms at home.   Past Medical History:  Diagnosis Date  . Anxiety   . Depression   . Iron deficiency anemia due to chronic blood loss 08/12/2018    Past Surgical History:  Procedure Laterality Date  . CHOLECYSTECTOMY    . WISDOM TOOTH EXTRACTION      Family History  Problem Relation Age of Onset  . Healthy Mother   . Diabetes Father   . Leukemia Maternal Aunt   . Colon cancer Maternal Grandmother   . Breast cancer Maternal Grandmother     Social History   Tobacco Use  . Smoking status: Current Some Day Smoker    Packs/day: 0.25    Types:  Cigarettes  . Smokeless tobacco: Never Used  . Tobacco comment: 1-2 cigarettes a week   Substance Use Topics  . Alcohol use: Yes    Alcohol/week: 2.0 standard drinks    Types: 2 Glasses of wine per week  . Drug use: No    Patient Active Problem List   Diagnosis Date Noted  . Iron deficiency anemia due to chronic blood loss 08/12/2018    Home Medications:    No outpatient medications have been marked as taking for the 12/25/18 encounter [facility] Encounter).    Allergies:   Patient has no known allergies.  Review of Systems (ROS): Review of Systems  Constitutional: Positive for fever (Tmax 99.8). Negative for chills and fatigue.  HENT: Positive for ear pain and sore throat. Negative for congestion, ear discharge, rhinorrhea, sinus pressure, sinus pain and trouble swallowing.   Respiratory: Negative for cough and shortness of breath.   Cardiovascular: Negative for chest pain and palpitations.  Musculoskeletal: Negative for myalgias.  Skin: Negative for color change, pallor and rash.  Neurological: Negative for dizziness, syncope, weakness and headaches.  All other systems reviewed and are negative.    Vital Signs: Today's Vitals   12/25/18 1123 12/25/18 1130  BP:  121/78  Pulse:  100  Resp:  15  Temp:  99.7 F (37.6 C)  TempSrc:  Oral  SpO2:  98%  Weight: 190 lb (86.2 kg)   Height: 5\' 9"  (1.753 m)   PainSc: 10-Worst pain ever     Physical Exam:  Physical Exam  Constitutional: She is oriented to person, place, and time and well-developed, well-nourished, and in no distress.  HENT:  Head: Normocephalic and atraumatic.  Right Ear: Tympanic membrane normal.  Left Ear: Tympanic membrane normal.  Nose: Nose normal.  Mouth/Throat: Uvula is midline and mucous membranes are normal. Posterior oropharyngeal erythema present.  RIGHT tonsil grade III with (+) white exudate  Eyes: Pupils are equal, round, and reactive to light.  Cardiovascular: Normal rate, regular rhythm,  normal heart sounds and intact distal pulses.  Pulmonary/Chest: Effort normal and breath sounds normal.  Musculoskeletal:     Cervical back: Normal range of motion and neck supple.  Lymphadenopathy:       Head (right side): Submandibular and tonsillar adenopathy present.  Neurological: She is alert and oriented to person, place, and time. Gait normal.  Skin: Skin is warm and dry. No rash noted. She is not diaphoretic.  Psychiatric: Mood, memory, affect and judgment normal.  Nursing note and vitals reviewed.   Urgent Care Treatments / Results:   LABS: PLEASE NOTE: all labs that were ordered this encounter are listed, however only abnormal results are displayed. Labs Reviewed  RAPID STREP SCREEN ([facility] ONLY)  CULTURE, GROUP A STREP [facility])    EKG: -None  RADIOLOGY: No results found.  PROCEDURES: Procedures  MEDICATIONS RECEIVED THIS VISIT: Medications  dexamethasone (DECADRON) injection 10 mg (10 mg Intramuscular Given 12/25/18 1156)  lidocaine (XYLOCAINE) 2 % viscous mouth solution 15 mL (15 mLs Mouth/Throat Given 12/25/18 1156)    PERTINENT CLINICAL COURSE NOTES/UPDATES:   Initial Impression / Assessment and Plan / Urgent Care Course:  Pertinent labs & imaging results that were available during my care of the patient were personally reviewed by me and considered in my medical decision making (see lab/imaging section of note for values and interpretations).  Eric Formrica L Mory is a 33 y.o. female who [facility] today with complaints of Sore Throat   Patient is well appearing overall in clinic today. She does not appear to be in any acute distress. Presenting symptoms (see HPI) and exam as documented above. Exam reveals significant swelling of RIGHT tonsil (grade III) with associated exudate. Rapid streptococcal throat swab (-); reflex culture sent. Patient with PMH (+) for recurrent streptococcal pharyngitis. Will proceed with treatment for exudative  tonsillitis. She is in significant pain and having considerable swelling; no evidence of peritonsillar abscess. Dexamethasone 10 mg IM and dose of gargled viscous lidocaine given in clinic. Pain improved. Prescriptions sent in a 10 day course of amoxicillin and a 4 day prednisone burst. Warm salt water gargles, hard candies, lozenges, and hot tea with honey/lemon recommended to soothe throat. Will also send in a supply of the viscous lidocaine for PRN use (gargle/spit). May use Tylenol and/or Ibuprofen as needed for pain/fever.   Current clinical condition warrants patient being out of work in order to recover from her current injury/illness. She was provided with the appropriate documentation to provide to her place of employment that will allow for her to RTW on 12/28/2018 with no restrictions.   Discussed follow up with primary care physician in 1 week for re-evaluation. I have reviewed the follow up and strict return precautions for any new or worsening symptoms. Patient is aware of symptoms that would be deemed urgent/emergent, and would thus require further evaluation either here or in the emergency department. At the time of discharge, she verbalized understanding and consent with the discharge plan as it was reviewed  with her. All questions were fielded by provider and/or clinic staff prior to patient discharge.    Final Clinical Impressions / Urgent Care Diagnoses:   Final diagnoses:  Exudative tonsillitis    New Prescriptions:  [place] Controlled Substance Registry consulted? Not Applicable  Meds ordered this encounter  Medications  . dexamethasone (DECADRON) injection 10 mg  . lidocaine (XYLOCAINE) 2 % viscous mouth solution 15 mL  . amoxicillin (AMOXIL) 875 MG tablet    Sig: Take 1 tablet (875 mg total) by mouth 2 (two) times daily for 10 days.    Dispense:  20 tablet    Refill:  0  . predniSONE (DELTASONE) 20 MG tablet    Sig: Take 2 tablets (40 mg total) by mouth daily.    Dispense:   8 tablet    Refill:  0  . lidocaine (XYLOCAINE) 2 % solution    Sig: Use as directed 10-15 mLs in the mouth or throat every 4 (four) hours as needed for mouth pain.    Dispense:  100 mL    Refill:  0    Recommended Follow up Care:  Patient encouraged to follow up with the following provider within the specified time frame, or sooner as dictated by the severity of her symptoms. As always, she was instructed that for any urgent/emergent care needs, she should seek care either here or in the emergency department for more immediate evaluation.  Follow-up Information    Margaretann Loveless, MD In 1 week.   Specialty: Internal Medicine Why: General reassessment of symptoms if not improving Contact information: [address] [place] [address] [phone]         NOTE: This note was prepared using Dragon dictation software along with smaller phrase technology. Despite my best ability to proofread, there is the potential that transcriptional errors may still occur from this process, and are completely unintentional.    Verlee Monte, NP 12/26/18 [phone]

## 2018-12-25 NOTE — Discharge Instructions (Signed)
It was very nice seeing you today in clinic. Thank you for entrusting me with your care.   Rest and Stay HYDRATED. Water and electrolyte containing beverages (Gatorade, Pedialyte) are best to prevent dehydration and electrolyte abnormalities.  Take the medications as prescribed. Warm salt water gargles, hard candies, and lozenges will help soothe your throat. Hot tea with honey/lemon may also help.   Make arrangements to follow up with your regular doctor in 1 week for re-evaluation if not improving. If your symptoms/condition worsens, please seek follow up care either here or in the ER. Please remember, our [place] providers are "right here with you" when you need [place].   Again, it was my pleasure to take care of you today. Thank you for choosing our clinic. I hope that you start to feel better quickly.   Honor Loh, MSN, APRN, FNP-C, CEN Advanced Practice Provider [facility]

## 2018-12-27 LAB — CULTURE, GROUP A STREP (THRC)

## 2019-03-16 ENCOUNTER — Other Ambulatory Visit: Payer: Self-pay

## 2019-03-17 ENCOUNTER — Inpatient Hospital Stay: Payer: Medicaid Other | Attending: Oncology

## 2019-03-23 ENCOUNTER — Telehealth: Payer: Self-pay

## 2019-03-23 NOTE — Telephone Encounter (Signed)
Patient is scheduled tomorrow for lab/poss Fera[facility].  Did not come for her 03/17/19 labs.  Please contact patient to get the lab (2 days prior)/MD/poss Feraheme rescheduled.

## 2019-03-23 NOTE — Telephone Encounter (Signed)
Tried calling pt x 2 I was unable to reach her due to her vmail being full I was unable to leave her a message. Also tried calling pts mother/ no answer and pts grandmother number is not in service.

## 2019-03-23 NOTE — Telephone Encounter (Signed)
Patient was called for pre-screening process when she stated to cancel the appts and she will cal back to r/s in 1-2 weeks.

## 2019-03-24 ENCOUNTER — Inpatient Hospital Stay: Payer: Medicaid Other

## 2019-03-24 ENCOUNTER — Inpatient Hospital Stay: Payer: Medicaid Other | Admitting: Oncology

## 2019-10-20 IMAGING — US US RENAL
1 series · 14 of 25 positions shown · non-contrast
Comparison: Abdominal ultrasound dated 07/12/2013

CLINICAL DATA: 33-year-old female with frequent UTIs.

EXAM:
RENAL / URINARY TRACT ULTRASOUND COMPLETE

[Series 1: us renal · 0.23mm/px · 14 of 27 slices shown]
[im 1/27]
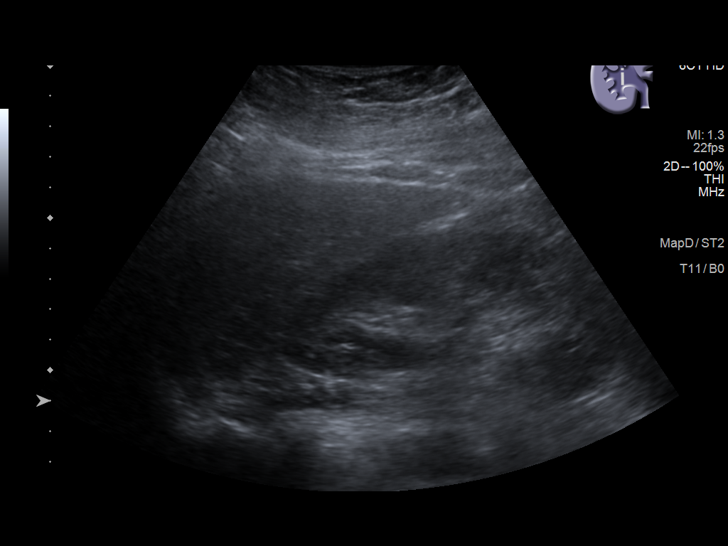
[im 3/27]
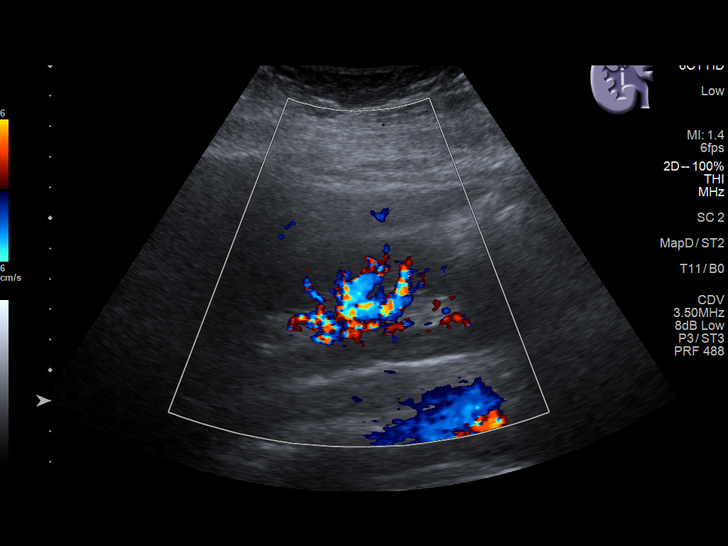
[im 5/27]
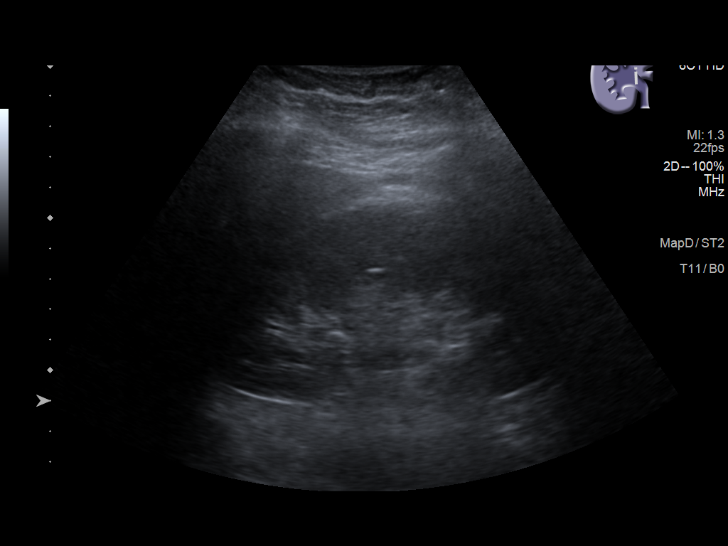
[im 7/27]
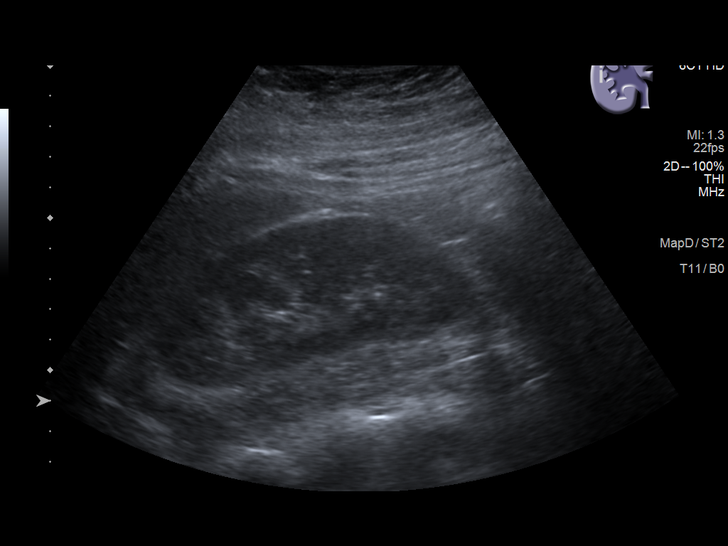
[im 9/27]
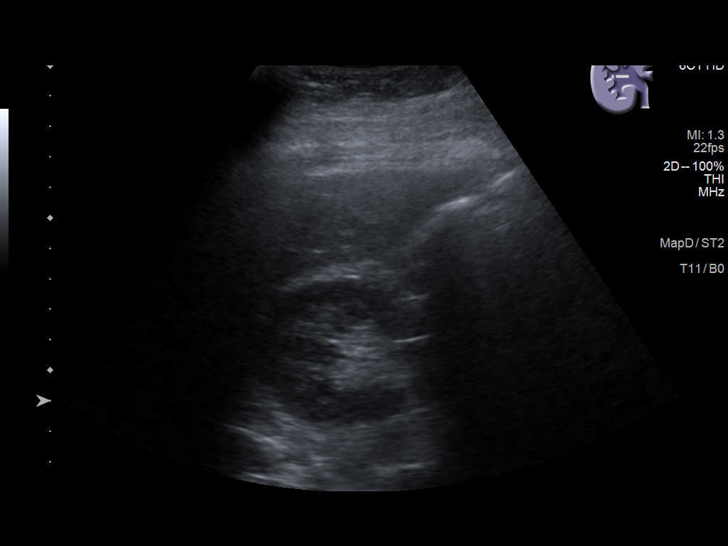
[im 10/27]
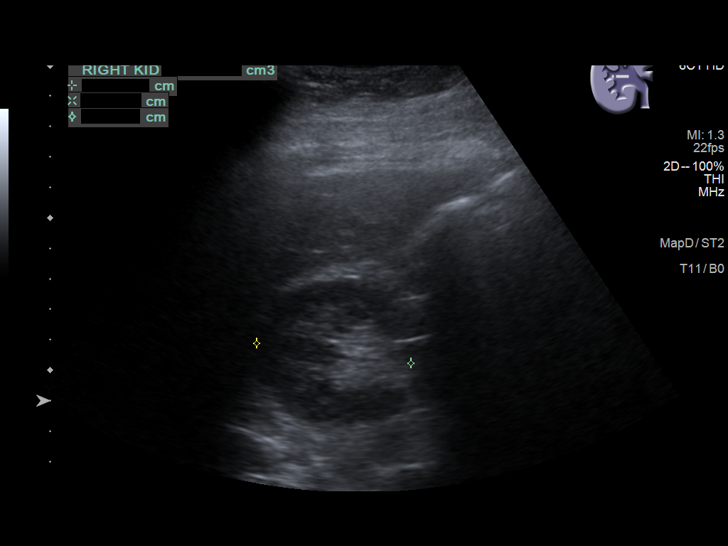
[im 12/27]
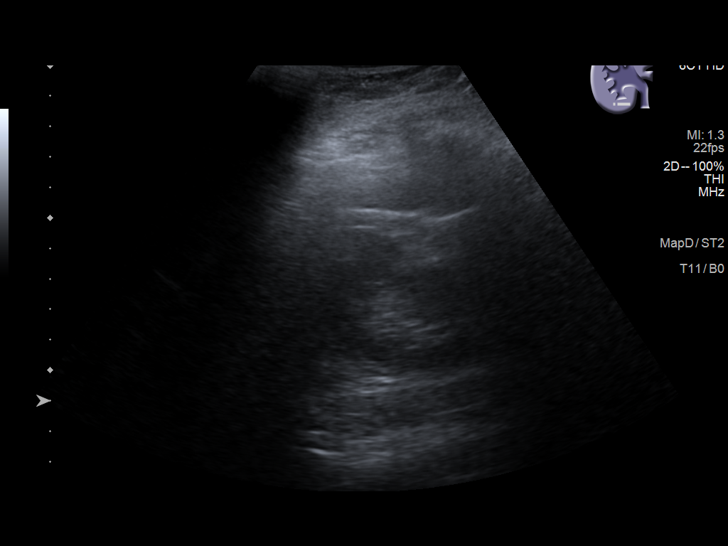
[im 15/27]
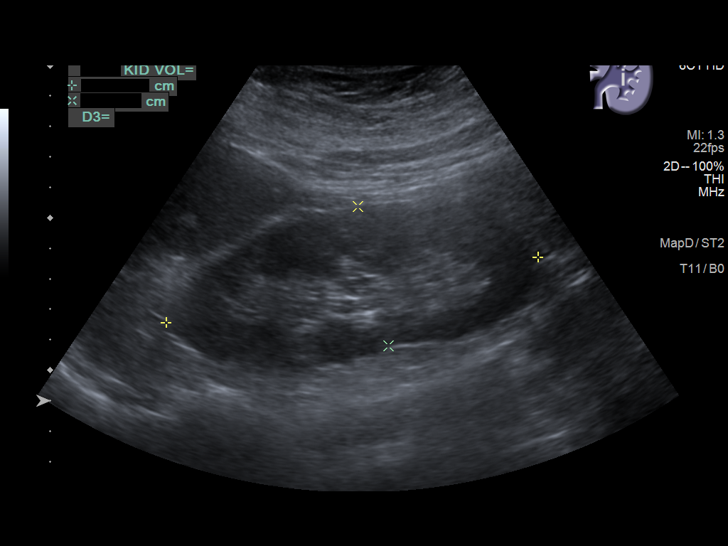
[im 17/27]
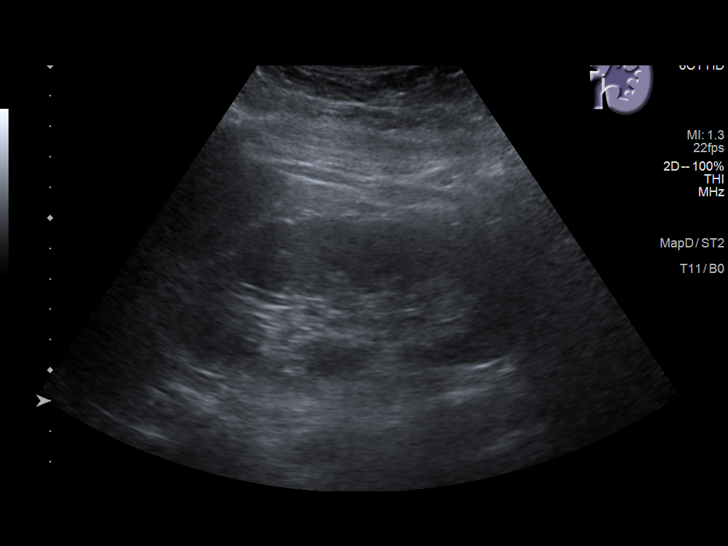
[im 18/27]
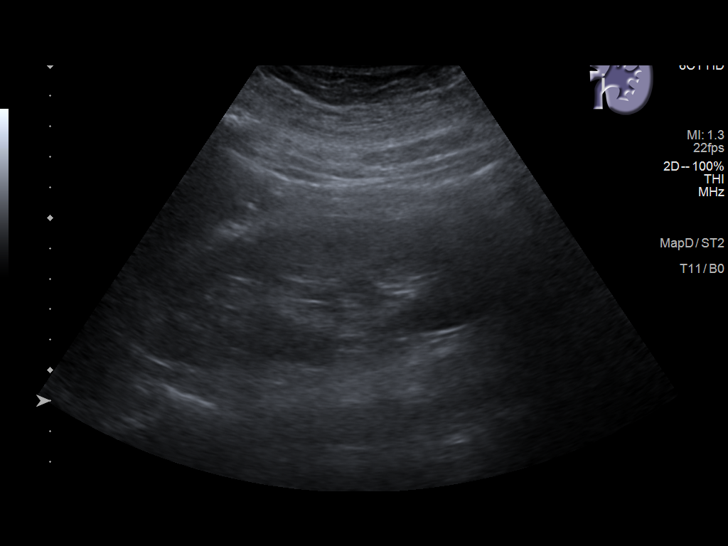
[im 20/27]
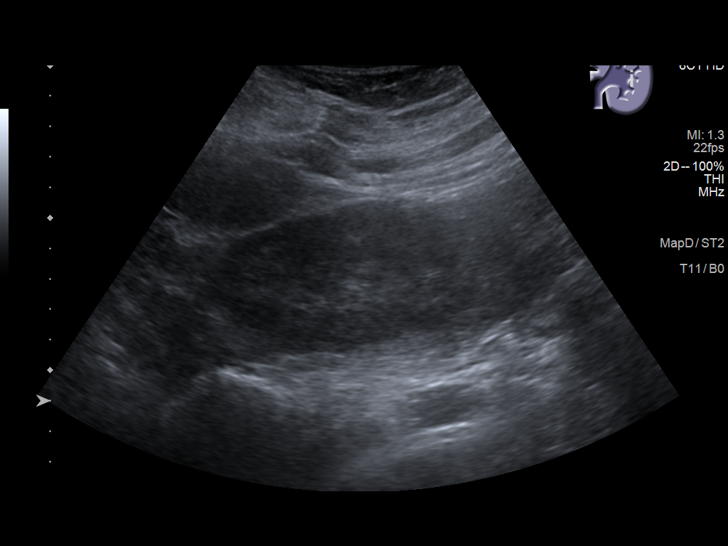
[im 22/27]
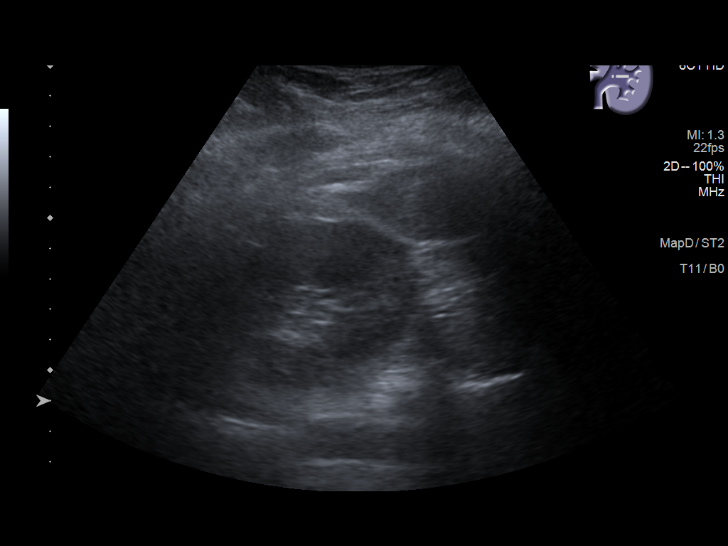
[im 24/27]
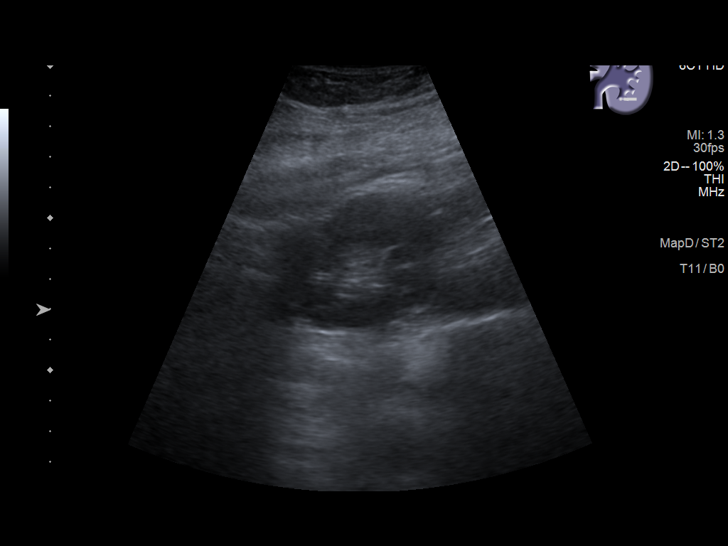
[im 27/27]
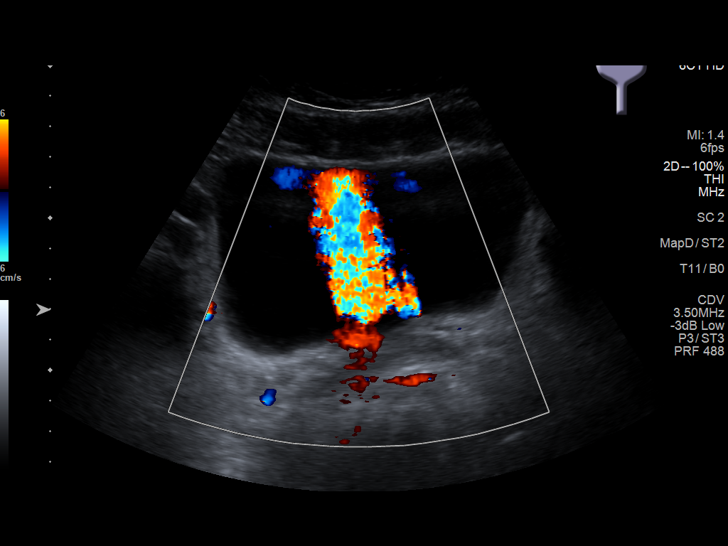

[14 of 25 positions shown; findings below may reference images not displayed]

FINDINGS: Right Kidney:

Renal measurements: 11.8 x 4.1 x 5.1 cm = volume: 129 mL .
Echogenicity within normal limits. No mass or hydronephrosis
visualized.

Left Kidney:

Renal measurements: 12.4 x 4.7 x 6.1 cm = volume: 184 mL.
Echogenicity within normal limits. No mass or hydronephrosis
visualized.

Bladder:

Appears normal for degree of bladder distention. Bilateral ureteral
jets noted.

Probable fatty infiltration of the liver.
IMPRESSION: Unremarkable renal ultrasound.

## 2020-08-18 ENCOUNTER — Encounter: Payer: Self-pay | Admitting: Oncology

## 2020-10-05 ENCOUNTER — Encounter: Payer: Self-pay | Admitting: Obstetrics & Gynecology

## 2022-06-08 ENCOUNTER — Encounter: Payer: Self-pay | Admitting: Oncology

## 2022-07-17 ENCOUNTER — Emergency Department (HOSPITAL_COMMUNITY)
Admission: EM | Admit: 2022-07-17 | Discharge: 2022-07-18 | Disposition: A | Discharge status: Home / Self Care | Payer: Medicaid Other | Attending: Emergency Medicine | Admitting: Emergency Medicine

## 2022-07-17 ENCOUNTER — Other Ambulatory Visit: Payer: Self-pay

## 2022-07-17 ENCOUNTER — Encounter: Payer: Self-pay | Admitting: Oncology

## 2022-07-17 DIAGNOSIS — R197 Diarrhea, unspecified: Secondary | ICD-10-CM | POA: Diagnosis not present

## 2022-07-17 DIAGNOSIS — D72829 Elevated white blood cell count, unspecified: Secondary | ICD-10-CM | POA: Diagnosis not present

## 2022-07-17 DIAGNOSIS — R109 Unspecified abdominal pain: Secondary | ICD-10-CM | POA: Diagnosis not present

## 2022-07-17 DIAGNOSIS — R112 Nausea with vomiting, unspecified: Secondary | ICD-10-CM | POA: Diagnosis present

## 2022-07-17 LAB — COMPREHENSIVE METABOLIC PANEL
ALT: 18 U/L (ref 0–44)
AST: 19 U/L (ref 15–41)
Albumin: 4.7 g/dL (ref 3.5–5.0)
Alkaline Phosphatase: 60 U/L (ref 38–126)
Anion gap: 8 (ref 5–15)
BUN: 10 mg/dL (ref 6–20)
CO2: 21 mmol/L — ABNORMAL LOW (ref 22–32)
Calcium: 9.4 mg/dL (ref 8.9–10.3)
Chloride: 110 mmol/L (ref 98–111)
Creatinine, Ser: 0.82 mg/dL (ref 0.44–1.00)
GFR, Estimated: >60 mL/min (ref >60)
Glucose, Bld: 136 mg/dL — ABNORMAL HIGH (ref 70–99)
Potassium: 3.7 mmol/L (ref 3.5–5.1)
Sodium: 139 mmol/L (ref 135–145)
Total Bilirubin: 1.2 mg/dL (ref 0.3–1.2)
Total Protein: 8 g/dL (ref 6.5–8.1)

## 2022-07-17 LAB — CBC
HCT: 40.9 % (ref 36.0–46.0)
Hemoglobin: 13.3 g/dL (ref 12.0–15.0)
MCH: 28.5 pg (ref 26.0–34.0)
MCHC: 32.5 g/dL (ref 30.0–36.0)
MCV: 87.8 fL (ref 80.0–100.0)
Platelets: 332 10*3/uL (ref 150–400)
RBC: 4.66 MIL/uL (ref 3.87–5.11)
RDW: 13.3 % (ref 11.5–15.5)
WBC: 10.9 10*3/uL — ABNORMAL HIGH (ref 4.0–10.5)
nRBC: 0 % (ref 0.0–0.2)

## 2022-07-17 LAB — LIPASE, BLOOD: Lipase: 28 U/L (ref 11–51)

## 2022-07-17 MED ORDER — SODIUM CHLORIDE 0.9 % IV BOLUS
1000.0000 mL | Freq: Once | INTRAVENOUS | Status: AC
  Administered 2022-07-17: 1000 mL via INTRAVENOUS

## 2022-07-17 MED ORDER — DROPERIDOL 2.5 MG/ML IJ SOLN
1.2500 mg | Freq: Once | INTRAMUSCULAR | Status: AC
  Administered 2022-07-18: 1.25 mg via INTRAVENOUS
  Filled 2022-07-17: qty 2

## 2022-07-17 MED ORDER — ALUM & MAG HYDROXIDE-SIMETH 200-200-20 MG/5ML PO SUSP
30.0000 mL | Freq: Once | ORAL | Status: AC
  Administered 2022-07-17: 30 mL via ORAL
  Filled 2022-07-17: qty 30

## 2022-07-17 MED ORDER — FAMOTIDINE IN NACL 20-0.9 MG/50ML-% IV SOLN
20.0000 mg | Freq: Once | INTRAVENOUS | Status: AC
  Administered 2022-07-17: 20 mg via INTRAVENOUS
  Filled 2022-07-17: qty 50

## 2022-07-17 MED ORDER — ONDANSETRON 4 MG PO TBDP
4.0000 mg | ORAL_TABLET | Freq: Once | ORAL | Status: AC | PRN
  Administered 2022-07-17: 4 mg via ORAL
  Filled 2022-07-17: qty 1

## 2022-07-17 NOTE — ED Provider Notes (Signed)
[place] EMERGENCY DEPARTMENT AT [facility] Provider Note   CSN: [id number] Arrival date & time: 07/17/22  2102     History  Chief Complaint  Patient presents with   Emesis   Abdominal Pain    Rose Baker is a 37 y.o. female who presents to the ED with concerns for emesis onset 1 PM today. Also notes burning sensation to her chest wall. Denies sick contacts. Has associated watery non-bloody diarrhea, nausea. No meds tried PTA. Denies vaginal discharge, constipation, shortness of breath. Denies history of GERD. Pt is currently on her menstrual cycle. Denies illicit drug use.   The history is provided by the patient. No language interpreter was used.       Home Medications Prior to Admission medications   Medication Sig Start Date End Date Taking? Authorizing Provider  metFORMIN (GLUCOPHAGE-XR) 500 MG 24 hr tablet Take 500 mg by mouth daily with breakfast. 05/18/22 05/18/23 Yes [provider]  ondansetron (ZOFRAN) 4 MG tablet Take 1 tablet (4 mg total) by mouth every 8 (eight) hours as needed for nausea or vomiting. 07/18/22  Yes Hadasah Brugger A, PA-C  dicyclomine (BENTYL) 20 MG tablet Take 1 tablet (20 mg total) by mouth 3 (three) times daily as needed (abdominal pain). 02/11/16 12/25/18  Phineas Semen, MD      Allergies    Patient has no known allergies.    Review of Systems   Review of Systems  Gastrointestinal:  Positive for vomiting.  All other systems reviewed and are negative.   Physical Exam Updated Vital Signs BP (!) 151/92 (BP Location: Right Arm)   Pulse 62   Temp 98.8 F (37.1 C) (Oral)   Resp 18   Ht 5\' 9"  (1.753 m)   Wt 98.4 kg   LMP 07/14/2022 (Exact Date)   SpO2 100%   BMI 32.05 kg/m  Physical Exam Vitals and nursing note reviewed.  Constitutional:      General: She is not in acute distress.    Appearance: She is not diaphoretic.  HENT:     Head: Normocephalic and atraumatic.     Mouth/Throat:     Pharynx: No  oropharyngeal exudate.  Eyes:     General: No scleral icterus.    Conjunctiva/sclera: Conjunctivae normal.  Cardiovascular:     Rate and Rhythm: Normal rate and regular rhythm.     Pulses: Normal pulses.     Heart sounds: Normal heart sounds.  Pulmonary:     Effort: Pulmonary effort is normal. No respiratory distress.     Breath sounds: Normal breath sounds. No wheezing.  Abdominal:     General: Bowel sounds are normal.     Palpations: Abdomen is soft. There is no mass.     Tenderness: There is no abdominal tenderness. There is no guarding or rebound.     Comments: No appreciable abdominal TTP noted.  Musculoskeletal:        General: Normal range of motion.     Cervical back: Normal range of motion and neck supple.  Skin:    General: Skin is warm and dry.  Neurological:     Mental Status: She is alert.  Psychiatric:        Behavior: Behavior normal.     ED Results / Procedures / Treatments   Labs (all labs ordered are listed, but only abnormal results are displayed) Labs Reviewed  COMPREHENSIVE METABOLIC PANEL - Abnormal; Notable for the following components:      Result Value  CO2 21 (*)    Glucose, Bld 136 (*)    All other components within normal limits  CBC - Abnormal; Notable for the following components:   WBC 10.9 (*)    All other components within normal limits  URINALYSIS, ROUTINE W REFLEX MICROSCOPIC - Abnormal; Notable for the following components:   Hgb urine dipstick MODERATE (*)    Ketones, ur 80 (*)    Protein, ur 100 (*)    Bacteria, UA RARE (*)    All other components within normal limits  LIPASE, BLOOD  PREGNANCY, URINE  TROPONIN I (HIGH SENSITIVITY)  TROPONIN I (HIGH SENSITIVITY)    EKG EKG Interpretation Date/Time:  Tuesday July 17 2022 22:54:43 EDT Ventricular Rate:  50 PR Interval:  143 QRS Duration:  94 QT Interval:  450 QTC Calculation: 411 R Axis:   40  Text Interpretation: Sinus rhythm Consider left atrial enlargement Minimal ST  depression, inferior leads Confirmed by Alona Bene [phone]) on 07/17/2022 11:50:32 PM  Radiology CT ABDOMEN PELVIS W CONTRAST  Result Date: 07/18/2022 CLINICAL DATA:  Abdominal pain with nausea and vomiting. Microhematuria. EXAM: CT ABDOMEN AND PELVIS WITH CONTRAST TECHNIQUE: Multidetector CT imaging of the abdomen and pelvis was performed using the standard protocol following bolus administration of intravenous contrast. RADIATION DOSE REDUCTION: This exam was performed according to the departmental dose-optimization program which includes automated exposure control, adjustment of the mA and/or kV according to patient size and/or use of iterative reconstruction technique. CONTRAST:  OMNIPAQUE IOHEXOL 300 MG/ML  SOLN COMPARISON:  None Available. FINDINGS: Lower chest: Mild atelectasis at the lung bases. Hepatobiliary: No focal liver abnormality is seen. Fatty infiltration of the liver is noted. Status post cholecystectomy. No biliary dilatation. Pancreas: Unremarkable. No pancreatic ductal dilatation or surrounding inflammatory changes. Spleen: Normal in size without focal abnormality. Adrenals/Urinary Tract: The adrenal glands are within normal limits. The kidneys enhance symmetrically. No renal calculus or hydronephrosis. No ureteral calculus or obstructive uropathy. The bladder is unremarkable. Stomach/Bowel: Stomach is within normal limits. Appendix appears normal. No evidence of bowel wall thickening, distention, or inflammatory changes. No free air or pneumatosis. Vascular/Lymphatic: No significant vascular findings are present. No enlarged abdominal or pelvic lymph nodes. Reproductive: The uterus is within normal limits. No adnexal mass is seen. Other: No abdominopelvic ascites. A small fat containing umbilical hernia is present. Musculoskeletal: No acute osseous abnormality. IMPRESSION: 1. No acute intra-abdominal process. 2. Hepatic steatosis. Electronically Signed   By: Thornell Sartorius M.D.   On:  07/18/2022 03:44    Procedures Procedures    Medications Ordered in ED Medications  ondansetron (ZOFRAN-ODT) disintegrating tablet 4 mg (4 mg Oral Given 07/17/22 2142)  sodium chloride 0.9 % bolus 1,000 mL (0 mLs Intravenous Stopped 07/18/22 0011)  famotidine (PEPCID) IVPB 20 mg premix (0 mg Intravenous Stopped 07/18/22 0010)  alum & mag hydroxide-simeth (MAALOX/MYLANTA) 200-200-20 MG/5ML suspension 30 mL (30 mLs Oral Given 07/17/22 2307)  droperidol (INAPSINE) 2.5 MG/ML injection 1.25 mg (1.25 mg Intravenous Given 07/18/22 0008)  iohexol (OMNIPAQUE) 300 MG/ML solution 100 mL (100 mLs Intravenous Contrast Given 07/18/22 0318)    ED Course/ Medical Decision Making/ A&P Clinical Course as of 07/18/22 0403  Tue Jul 17, 2022  2321 Pt re-evaluated due to bradycardia noted at 45. Pt able to talk and denies any chest pain or shortness of breath at this time. Review of patient chart and notes history of heart rate in low 60's. [SB]  Wed Jul 18, 2022  0139 Re-evaluated and noted  mild improvement of symptoms with treatment regimen.  [SB]  0356 Re-evaluated and discussed with patient lab and imaging results. Pt noted improvement of symptoms with treatment regimen. Able to tolerate PO intake prior to discharge. Discussed discharge treatment plan. Pt agreeable at this time. Pt appears safe for discharge. [SB]    Clinical Course User Index [SB] Alberta Cairns A, PA-C                             Medical Decision Making Amount and/or Complexity of Data Reviewed Labs: ordered. Radiology: ordered.  Risk OTC drugs. Prescription drug management.   Patient presents to the emergency department with emesis onset 1 PM today. On exam patient with no abdominal TTP. Remainder of exam without acute findings.  Pt afebrile. Differential diagnosis includes pancreatitis, cholecystitis, appendicitis, acute cystitis, diverticulitis, GERD.   Labs:  I ordered, and personally interpreted labs.  The pertinent results  include:  Lipase unremarkable CBC with slightly elevated leukocytosis at 10.9 otherwise unremarkable Troponin at 3 Negative pregnancy urine UA with moderate hgb (pt on her menstrual cycle) Lipase unremarkable  Imaging: I ordered imaging studies including CT abdomen pelvis with I independently visualized and interpreted imaging which showed no acute findings I agree with the radiologist interpretation  Medications:  I ordered medication including IVF, zofran, droperidol, pepcid, GI cocktail for symptom management. Reevaluation of the patient after these medicines and interventions, I reevaluated the patient and found that they have improved I have reviewed the patients home medicines and have made adjustments as needed Tolerated PO challenge in ED with above treatment regimen    Disposition: Presentation suspicious for nausea and vomting, likely viral etiology. Doubt concerns at this time for acute cystitis, pancreatitis, cholecystitis, diverticulitis.. After consideration of the diagnostic results and the patients response to treatment, I feel that the patient would benefit from Discharge home.  Patient sent with a prescription for Zofran.  Work note provided.  Patient instructed to follow-up with primary care provider as needed.  Supportive care measures and strict return precautions discussed with patient at bedside. Pt acknowledges and verbalizes understanding. Pt appears safe for discharge. Follow up as indicated in discharge paperwork.   This chart was dictated using voice recognition software, Dragon. Despite the best efforts of this provider to proofread and correct errors, errors may still occur which can change documentation meaning.   Final Clinical Impression(s) / ED Diagnoses Final diagnoses:  Nausea and vomiting, unspecified vomiting type    Rx / DC Orders ED Discharge Orders          Ordered    ondansetron (ZOFRAN) 4 MG tablet  Every 8 hours PRN        07/18/22 0400               Najae Rathert A, PA-C 07/18/22 0441    Maia Plan, MD 07/19/22 [phone]

## 2022-07-17 NOTE — ED Triage Notes (Signed)
Pt reports multiple episodes of emesis since lunch time today. Pt report pain in throat that goes down to stomach. Pt has had gallbladder removed.

## 2022-07-18 ENCOUNTER — Encounter (HOSPITAL_COMMUNITY): Payer: Self-pay

## 2022-07-18 ENCOUNTER — Emergency Department (HOSPITAL_COMMUNITY): Payer: Medicaid Other

## 2022-07-18 LAB — URINALYSIS, ROUTINE W REFLEX MICROSCOPIC
Bilirubin Urine: NEGATIVE
Glucose, UA: NEGATIVE mg/dL
Ketones, ur: 80 mg/dL — AB
Leukocytes,Ua: NEGATIVE
Nitrite: NEGATIVE
Protein, ur: 100 mg/dL — AB
Specific Gravity, Urine: 1.026 (ref 1.005–1.030)
pH: 8 (ref 5.0–8.0)

## 2022-07-18 LAB — PREGNANCY, URINE: Preg Test, Ur: NEGATIVE

## 2022-07-18 LAB — TROPONIN I (HIGH SENSITIVITY): Troponin I (High Sensitivity): 3 ng/L (ref <18)

## 2022-07-18 MED ORDER — ONDANSETRON HCL 4 MG PO TABS: 4.0000 mg | ORAL_TABLET | Freq: Three times a day (TID) | ORAL | 0 refills | Status: DC | PRN

## 2022-07-18 MED ORDER — SODIUM CHLORIDE (PF) 0.9 % IJ SOLN
INTRAMUSCULAR | Status: AC
  Filled 2022-07-18: qty 50

## 2022-07-18 MED ORDER — IOHEXOL 300 MG/ML  SOLN
100.0000 mL | Freq: Once | INTRAMUSCULAR | Status: AC | PRN
  Administered 2022-07-18: 100 mL via INTRAVENOUS

## 2022-07-18 NOTE — Discharge Instructions (Addendum)
It was a pleasure taking care of you today!  Your lab and imaging studies did not show any concerning emergent findings at this time.  You will be sent a prescription for Zofran, take as directed if you are experiencing nausea/vomiting.  If you have to take Zofran, wait at least 30 minutes before you attempt small sips/small bites of food/fluid.  Ensure to maintain fluid intake with water, tea, broth, soup, Pedialyte, Gatorade.  Follow-up with your primary care provider as needed regarding this ED visit.  Return to the emergency department if you experience increasing/worsening symptoms. 

## 2022-07-18 NOTE — ED Provider Notes (Incomplete)
[place] EMERGENCY DEPARTMENT AT [facility] Provider Note   CSN: [id number] Arrival date & time: 07/17/22  2102     History  Chief Complaint  Patient presents with  . Emesis  . Abdominal Pain    Rose Baker is a 37 y.o. female who presents to the ED with concerns for emesis onset 1 PM today. Also notes burning sensation to her chest wall. Denies sick contacts. Has associated watery non-bloody diarrhea, nausea. No meds tried PTA. Denies vaginal discharge, constipation, shortness of breath. Denies history of GERD. Pt is currently on her menstrual cycle. Denies illicit drug use.   The history is provided by the patient. No language interpreter was used.       Home Medications Prior to Admission medications   Medication Sig Start Date End Date Taking? Authorizing Provider  lidocaine (XYLOCAINE) 2 % solution Use as directed 10-15 mLs in the mouth or throat every 4 (four) hours as needed for mouth pain. 12/25/18   Verlee Monte, NP  predniSONE (DELTASONE) 20 MG tablet Take 2 tablets (40 mg total) by mouth daily. 12/25/18   Verlee Monte, NP  dicyclomine (BENTYL) 20 MG tablet Take 1 tablet (20 mg total) by mouth 3 (three) times daily as needed (abdominal pain). 02/11/16 12/25/18  Phineas Semen, MD      Allergies    Patient has no known allergies.    Review of Systems   Review of Systems  Gastrointestinal:  Positive for abdominal pain and vomiting.  All other systems reviewed and are negative.   Physical Exam Updated Vital Signs BP (!) 151/92 (BP Location: Right Arm)   Pulse 62   Temp 98.8 F (37.1 C) (Oral)   Resp 18   Ht 5\' 9"  (1.753 m)   Wt 98.4 kg   LMP 07/14/2022 (Exact Date)   SpO2 100%   BMI 32.05 kg/m  Physical Exam Vitals and nursing note reviewed.  Constitutional:      General: She is not in acute distress.    Appearance: She is not diaphoretic.  HENT:     Head: Normocephalic and atraumatic.     Mouth/Throat:     Pharynx: No  oropharyngeal exudate.  Eyes:     General: No scleral icterus.    Conjunctiva/sclera: Conjunctivae normal.  Cardiovascular:     Rate and Rhythm: Normal rate and regular rhythm.     Pulses: Normal pulses.     Heart sounds: Normal heart sounds.  Pulmonary:     Effort: Pulmonary effort is normal. No respiratory distress.     Breath sounds: Normal breath sounds. No wheezing.  Abdominal:     General: Bowel sounds are normal.     Palpations: Abdomen is soft. There is no mass.     Tenderness: There is no abdominal tenderness. There is no guarding or rebound.  Musculoskeletal:        General: Normal range of motion.     Cervical back: Normal range of motion and neck supple.  Skin:    General: Skin is warm and dry.  Neurological:     Mental Status: She is alert.  Psychiatric:        Behavior: Behavior normal.     ED Results / Procedures / Treatments   Labs (all labs ordered are listed, but only abnormal results are displayed) Labs Reviewed  COMPREHENSIVE METABOLIC PANEL - Abnormal; Notable for the following components:      Result Value   CO2 21 (*)  Glucose, Bld 136 (*)    All other components within normal limits  CBC - Abnormal; Notable for the following components:   WBC 10.9 (*)    All other components within normal limits  LIPASE, BLOOD  URINALYSIS, ROUTINE W REFLEX MICROSCOPIC  HCG, SERUM, QUALITATIVE    EKG None  Radiology No results found.  Procedures Procedures    Medications Ordered in ED Medications  sodium chloride 0.9 % bolus 1,000 mL (has no administration in time range)  famotidine (PEPCID) IVPB 20 mg premix (has no administration in time range)  alum & mag hydroxide-simeth (MAALOX/MYLANTA) 200-200-20 MG/5ML suspension 30 mL (has no administration in time range)  ondansetron (ZOFRAN-ODT) disintegrating tablet 4 mg (4 mg Oral Given 07/17/22 2142)    ED Course/ Medical Decision Making/ A&P Clinical Course as of 07/18/22 0001  Tue Jul 17, 2022   2321 Pt re-evaluated due to bradycardia noted at 45. Pt able to talk and denies any chest pain or shortness of breath at this time. Review of patient chart and notes history of heart rate in low 60's. [SB]    Clinical Course User Index [SB] Esteban Kobashigawa A, PA-C                             Medical Decision Making Risk OTC drugs. Prescription drug management.   Patient presents to the emergency department with emesis onset 1 PM today. On exam patient with no abdominal TTP. Remainder of exam without acute findings.  Pt afebrile. Differential diagnosis includes pancreatitis, cholecystitis, appendicitis, acute cystitis, ***, GERD.   Additional history obtained:  Additional history obtained from {sabhistory:27144} External records from outside source obtained and reviewed including: ***  Labs:  I ordered, and personally interpreted labs.  The pertinent results include:  Lipase unremarkable CBC with slightly elevated leukocytosis at 10.9 otherwise unremarkable   Imaging: I ordered imaging studies including {sabimaging:28099} I independently visualized and interpreted imaging which showed *** I agree with the radiologist interpretation  Medications:  I ordered medication including {sabmeds:28100} for ***. {GI cocktail} Reevaluation of the patient after these medicines and interventions, I reevaluated the patient and found that they have {resolved/improved/worsened:23923::"improved"} I have reviewed the patients home medicines and have made adjustments as needed Tolerated PO challenge in ED with above treatment regimen ***  {Cardiac Monitoring: The patient was maintained on a cardiac monitor.  I personally viewed and interpreted the cardiac monitored which showed an underlying rhythm of: ***.   Test Considered: ***   Critical Interventions ***}   {Consultations: I requested consultation with the {sabspecialists:27145}, and discussed lab and imaging findings as well as  pertinent plan - they recommend: ***}   Disposition: {End of MDM here with the likely diagnosis}. After consideration of the diagnostic results and the patients response to treatment, I feel that the patient would benefit from {sabdispo:27146}. {Discharge home with prescription Zofran/phenergan.} {Will provide information for on-call Gastroenterologist. Instructed pt to call and set up follow up appointment regarding todays ED visit with the gastroenterologist.} Supportive care measures and strict return precautions discussed with patient at bedside. Pt acknowledges and verbalizes understanding. Pt appears safe for discharge. Follow up as indicated in discharge paperwork.   This chart was dictated using voice recognition software, Dragon. Despite the best efforts of this provider to proofread and correct errors, errors may still occur which can change documentation meaning.   Final Clinical Impression(s) / ED Diagnoses Final diagnoses:  None  Rx / DC Orders ED Discharge Orders     None

## 2022-09-13 ENCOUNTER — Emergency Department
Admission: EM | Admit: 2022-09-13 | Discharge: 2022-09-13 | Disposition: A | Discharge status: Home / Self Care | Payer: Medicaid Other | Attending: Emergency Medicine | Admitting: Emergency Medicine

## 2022-09-13 ENCOUNTER — Other Ambulatory Visit: Payer: Self-pay

## 2022-09-13 DIAGNOSIS — D72829 Elevated white blood cell count, unspecified: Secondary | ICD-10-CM | POA: Insufficient documentation

## 2022-09-13 DIAGNOSIS — R112 Nausea with vomiting, unspecified: Secondary | ICD-10-CM

## 2022-09-13 LAB — URINALYSIS, ROUTINE W REFLEX MICROSCOPIC
Bacteria, UA: NONE SEEN
Bilirubin Urine: NEGATIVE
Glucose, UA: NEGATIVE mg/dL
Ketones, ur: 80 mg/dL — AB
Leukocytes,Ua: NEGATIVE
Nitrite: NEGATIVE
Protein, ur: 100 mg/dL — AB
Specific Gravity, Urine: 1.032 — ABNORMAL HIGH (ref 1.005–1.030)
pH: 6 (ref 5.0–8.0)

## 2022-09-13 LAB — CBC
HCT: 40.1 % (ref 36.0–46.0)
Hemoglobin: 13.1 g/dL (ref 12.0–15.0)
MCH: 28.5 pg (ref 26.0–34.0)
MCHC: 32.7 g/dL (ref 30.0–36.0)
MCV: 87.2 fL (ref 80.0–100.0)
Platelets: 371 10*3/uL (ref 150–400)
RBC: 4.6 MIL/uL (ref 3.87–5.11)
RDW: 12.4 % (ref 11.5–15.5)
WBC: 14.2 10*3/uL — ABNORMAL HIGH (ref 4.0–10.5)
nRBC: 0 % (ref 0.0–0.2)

## 2022-09-13 LAB — COMPREHENSIVE METABOLIC PANEL
ALT: 16 U/L (ref 0–44)
AST: 23 U/L (ref 15–41)
Albumin: 4.8 g/dL (ref 3.5–5.0)
Alkaline Phosphatase: 60 U/L (ref 38–126)
Anion gap: 12 (ref 5–15)
BUN: 14 mg/dL (ref 6–20)
CO2: 21 mmol/L — ABNORMAL LOW (ref 22–32)
Calcium: 9.5 mg/dL (ref 8.9–10.3)
Chloride: 107 mmol/L (ref 98–111)
Creatinine, Ser: 0.75 mg/dL (ref 0.44–1.00)
GFR, Estimated: >60 mL/min (ref >60)
Glucose, Bld: 147 mg/dL — ABNORMAL HIGH (ref 70–99)
Potassium: 4 mmol/L (ref 3.5–5.1)
Sodium: 140 mmol/L (ref 135–145)
Total Bilirubin: 0.8 mg/dL (ref 0.3–1.2)
Total Protein: 8.5 g/dL — ABNORMAL HIGH (ref 6.5–8.1)

## 2022-09-13 LAB — POC URINE PREG, ED: Preg Test, Ur: NEGATIVE

## 2022-09-13 LAB — LIPASE, BLOOD: Lipase: 29 U/L (ref 11–51)

## 2022-09-13 MED ORDER — ONDANSETRON HCL 4 MG/2ML IJ SOLN: 4.0000 mg | Freq: Once | INTRAMUSCULAR | Status: DC

## 2022-09-13 MED ORDER — ONDANSETRON HCL 4 MG/2ML IJ SOLN
4.0000 mg | Freq: Once | INTRAMUSCULAR | Status: AC
  Administered 2022-09-13: 4 mg via INTRAVENOUS
  Filled 2022-09-13: qty 2

## 2022-09-13 MED ORDER — PROMETHAZINE HCL 12.5 MG RE SUPP: 12.5000 mg | Freq: Four times a day (QID) | RECTAL | 0 refills | Status: DC | PRN

## 2022-09-13 MED ORDER — SODIUM CHLORIDE 0.9 % IV BOLUS
1000.0000 mL | Freq: Once | INTRAVENOUS | Status: AC
  Administered 2022-09-13: 1000 mL via INTRAVENOUS

## 2022-09-13 MED ORDER — DROPERIDOL 2.5 MG/ML IJ SOLN
2.5000 mg | Freq: Once | INTRAMUSCULAR | Status: AC
  Administered 2022-09-13: 2.5 mg via INTRAVENOUS
  Filled 2022-09-13: qty 2

## 2022-09-13 NOTE — ED Notes (Signed)
Pt attempting to use the bathroom, pt ambulatory to bathroom without assistance.

## 2022-09-13 NOTE — ED Provider Notes (Signed)
[facility] Provider Note    Event Date/Time   First MD Initiated Contact with Patient 09/13/22 Paulo Fruit     (approximate)  History   Chief Complaint: Emesis  HPI  Rose Baker is a 37 y.o. female with a past medical history anxiety, presents emergency department for nausea and vomiting.  According to the patient since this morning she has been nausea frequent episodes of vomiting.  States she has not been able to keep down any food or drink today.  Patient states she had some leftover nausea medication she tried to use but vomited shortly afterwards.  Patient states this has happened before.  States she was in the hospital about 2 months ago for the same, states what ever medication she got 2 months ago seem to work very well for her but she does not know what medication this was.  Physical Exam   Triage Vital Signs: ED Triage Vitals  Encounter Vitals Group     BP 09/13/22 1813 (!) 139/95     Systolic BP Percentile --      Diastolic BP Percentile --      Pulse Rate 09/13/22 1813 61     Resp 09/13/22 1813 18     Temp 09/13/22 1813 99.2 F (37.3 C)     Temp src --      SpO2 09/13/22 1813 100 %     Weight 09/13/22 1814 211 lb (95.7 kg)     Height 09/13/22 1814 5\' 9"  (1.753 m)     Head Circumference --      Peak Flow --      Pain Score 09/13/22 1814 0     Pain Loc --      Pain Education --      Exclude from Growth Chart --     Most recent vital signs: Vitals:   09/13/22 1813  BP: (!) 139/95  Pulse: 61  Resp: 18  Temp: 99.2 F (37.3 C)  SpO2: 100%    General: Awake, no distress.  CV:  Good peripheral perfusion.  Regular rate and rhythm  Resp:  Normal effort.  Equal breath sounds bilaterally.  Abd:  No distention.  Soft, slight diffuse tenderness without focal tenderness identified.  No rebound or guarding.  ED Results / Procedures / Treatments   EKG  EKG viewed and interpreted by myself shows normal sinus rhythm at 54 bpm with a narrow  QRS, normal axis, normal intervals, no concerning ST changes.  MEDICATIONS ORDERED IN ED: Medications  sodium chloride 0.9 % bolus 1,000 mL (has no administration in time range)     IMPRESSION / MDM / ASSESSMENT AND PLAN / ED COURSE  I reviewed the triage vital signs and the nursing notes.  Patient's presentation is most consistent with acute presentation with potential threat to life or bodily function.  Patient presents emergency department for nausea and vomiting.  Patient states a history of the same which has happened multiple times including last month.  Denies any focal abdominal pain.  Last menstrual period stopped 2 days ago.  Patient's lab work shows mild leukocytosis white blood cell count of 14,000, reassuring chemistry reassuring lipase.  We will dose IV fluids.  I reviewed the patient's last ED chart from earlier last month, patient received 2.5 mg of IV droperidol and felt much better.  We will check an EKG as long as the patient's QT is within normal range we will dose IV droperidol in addition to the IV  fluids.  Patient agreeable to plan of care.  Suspect that the patient may be suffering from hyperemesis or possibly cannabinoid induced hyperemesis.  Patient's workup is reassuring with a reassuring chemistry, normal lipase, normal LFTs, negative pregnancy test, mild leukocytosis, reassuring urinalysis besides mild ketones.  We will continue to IV hydrate the patient.  Patient received 2.5 mg of droperidol.  Will reassess.  Patient is awake alert.  States she feels much better.  Will discharge with Phenergan suppositories to be used if needed.  Discussed supportive care including oral hydration and return precautions.  FINAL CLINICAL IMPRESSION(S) / ED DIAGNOSES   Nausea vomiting  Note:  This document was prepared using Dragon voice recognition software and may include unintentional dictation errors.   Minna Antis, MD 09/13/22 2027

## 2022-09-13 NOTE — ED Triage Notes (Signed)
Pt to ED For emesis started this am. Reports cannot keep food or drink down. Reports has tried antiemetics.

## 2022-09-14 ENCOUNTER — Inpatient Hospital Stay
Admission: EM | Admit: 2022-09-14 | Discharge: 2022-09-21 | DRG: 641 | Disposition: A | Discharge status: Home / Self Care | Payer: Medicaid Other | Attending: Internal Medicine | Admitting: Internal Medicine

## 2022-09-14 ENCOUNTER — Other Ambulatory Visit: Payer: Self-pay

## 2022-09-14 DIAGNOSIS — R001 Bradycardia, unspecified: Secondary | ICD-10-CM | POA: Insufficient documentation

## 2022-09-14 DIAGNOSIS — Z800 Family history of malignant neoplasm of digestive organs: Secondary | ICD-10-CM

## 2022-09-14 DIAGNOSIS — R197 Diarrhea, unspecified: Secondary | ICD-10-CM | POA: Diagnosis present

## 2022-09-14 DIAGNOSIS — Z3202 Encounter for pregnancy test, result negative: Secondary | ICD-10-CM | POA: Diagnosis present

## 2022-09-14 DIAGNOSIS — K209 Esophagitis, unspecified without bleeding: Secondary | ICD-10-CM | POA: Insufficient documentation

## 2022-09-14 DIAGNOSIS — Z7984 Long term (current) use of oral hypoglycemic drugs: Secondary | ICD-10-CM

## 2022-09-14 DIAGNOSIS — F419 Anxiety disorder, unspecified: Secondary | ICD-10-CM | POA: Diagnosis present

## 2022-09-14 DIAGNOSIS — K2970 Gastritis, unspecified, without bleeding: Secondary | ICD-10-CM | POA: Insufficient documentation

## 2022-09-14 DIAGNOSIS — F1290 Cannabis use, unspecified, uncomplicated: Secondary | ICD-10-CM | POA: Diagnosis present

## 2022-09-14 DIAGNOSIS — Z6830 Body mass index (BMI) 30.0-30.9, adult: Secondary | ICD-10-CM

## 2022-09-14 DIAGNOSIS — Z833 Family history of diabetes mellitus: Secondary | ICD-10-CM

## 2022-09-14 DIAGNOSIS — E669 Obesity, unspecified: Secondary | ICD-10-CM | POA: Insufficient documentation

## 2022-09-14 DIAGNOSIS — R112 Nausea with vomiting, unspecified: Principal | ICD-10-CM | POA: Diagnosis present

## 2022-09-14 DIAGNOSIS — Z806 Family history of leukemia: Secondary | ICD-10-CM

## 2022-09-14 DIAGNOSIS — E876 Hypokalemia: Secondary | ICD-10-CM

## 2022-09-14 DIAGNOSIS — R109 Unspecified abdominal pain: Secondary | ICD-10-CM

## 2022-09-14 DIAGNOSIS — K760 Fatty (change of) liver, not elsewhere classified: Secondary | ICD-10-CM | POA: Diagnosis present

## 2022-09-14 DIAGNOSIS — F17210 Nicotine dependence, cigarettes, uncomplicated: Secondary | ICD-10-CM | POA: Diagnosis present

## 2022-09-14 DIAGNOSIS — Z83719 Family history of colon polyps, unspecified: Secondary | ICD-10-CM

## 2022-09-14 DIAGNOSIS — E872 Acidosis, unspecified: Principal | ICD-10-CM | POA: Insufficient documentation

## 2022-09-14 DIAGNOSIS — R1110 Vomiting, unspecified: Secondary | ICD-10-CM

## 2022-09-14 LAB — COMPREHENSIVE METABOLIC PANEL
ALT: 13 U/L (ref 0–44)
AST: 18 U/L (ref 15–41)
Albumin: 3.4 g/dL — ABNORMAL LOW (ref 3.5–5.0)
Alkaline Phosphatase: 36 U/L — ABNORMAL LOW (ref 38–126)
Anion gap: 7 (ref 5–15)
BUN: 15 mg/dL (ref 6–20)
CO2: 18 mmol/L — ABNORMAL LOW (ref 22–32)
Calcium: 6.7 mg/dL — ABNORMAL LOW (ref 8.9–10.3)
Chloride: 118 mmol/L — ABNORMAL HIGH (ref 98–111)
Creatinine, Ser: 0.51 mg/dL (ref 0.44–1.00)
GFR, Estimated: >60 mL/min (ref >60)
Glucose, Bld: 102 mg/dL — ABNORMAL HIGH (ref 70–99)
Potassium: 2.5 mmol/L — CL (ref 3.5–5.1)
Sodium: 143 mmol/L (ref 135–145)
Total Bilirubin: 0.7 mg/dL (ref 0.3–1.2)
Total Protein: 5.6 g/dL — ABNORMAL LOW (ref 6.5–8.1)

## 2022-09-14 LAB — CBC
HCT: 38 % (ref 36.0–46.0)
Hemoglobin: 12.5 g/dL (ref 12.0–15.0)
MCH: 28.9 pg (ref 26.0–34.0)
MCHC: 32.9 g/dL (ref 30.0–36.0)
MCV: 88 fL (ref 80.0–100.0)
Platelets: 361 10*3/uL (ref 150–400)
RBC: 4.32 MIL/uL (ref 3.87–5.11)
RDW: 12.5 % (ref 11.5–15.5)
WBC: 11.2 10*3/uL — ABNORMAL HIGH (ref 4.0–10.5)
nRBC: 0 % (ref 0.0–0.2)

## 2022-09-14 LAB — MAGNESIUM: Magnesium: 1.6 mg/dL — ABNORMAL LOW (ref 1.7–2.4)

## 2022-09-14 LAB — URINE DRUG SCREEN, QUALITATIVE (ARMC ONLY)
Amphetamines, Ur Screen: NOT DETECTED
Barbiturates, Ur Screen: NOT DETECTED
Benzodiazepine, Ur Scrn: NOT DETECTED
Cannabinoid 50 Ng, Ur ~~LOC~~: POSITIVE — AB
Cocaine Metabolite,Ur ~~LOC~~: NOT DETECTED
MDMA (Ecstasy)Ur Screen: NOT DETECTED
Methadone Scn, Ur: NOT DETECTED
Opiate, Ur Screen: NOT DETECTED
Phencyclidine (PCP) Ur S: NOT DETECTED
Tricyclic, Ur Screen: NOT DETECTED

## 2022-09-14 LAB — POC URINE PREG, ED: Preg Test, Ur: NEGATIVE

## 2022-09-14 MED ORDER — MAGNESIUM OXIDE -MG SUPPLEMENT 400 (240 MG) MG PO TABS
400.0000 mg | ORAL_TABLET | Freq: Once | ORAL | Status: AC
  Administered 2022-09-15: 400 mg via ORAL
  Filled 2022-09-14: qty 1

## 2022-09-14 MED ORDER — PROMETHAZINE HCL 25 MG RE SUPP
25.0000 mg | Freq: Once | RECTAL | Status: AC
  Administered 2022-09-14: 25 mg via RECTAL
  Filled 2022-09-14: qty 1

## 2022-09-14 MED ORDER — DROPERIDOL 2.5 MG/ML IJ SOLN
2.5000 mg | Freq: Once | INTRAMUSCULAR | Status: AC
  Administered 2022-09-14: 2.5 mg via INTRAVENOUS
  Filled 2022-09-14: qty 2

## 2022-09-14 MED ORDER — SODIUM CHLORIDE 0.9 % IV SOLN: Freq: Once | INTRAVENOUS | Status: AC

## 2022-09-14 MED ORDER — POTASSIUM CHLORIDE 10 MEQ/100ML IV SOLN
10.0000 meq | INTRAVENOUS | Status: AC
  Administered 2022-09-14 (×2): 10 meq via INTRAVENOUS
  Filled 2022-09-14: qty 100

## 2022-09-14 MED ORDER — POTASSIUM CHLORIDE CRYS ER 20 MEQ PO TBCR
20.0000 meq | EXTENDED_RELEASE_TABLET | Freq: Once | ORAL | Status: AC
  Administered 2022-09-14: 20 meq via ORAL
  Filled 2022-09-14: qty 1

## 2022-09-14 MED ORDER — SODIUM CHLORIDE 0.9 % IV BOLUS
1000.0000 mL | Freq: Once | INTRAVENOUS | Status: AC
  Administered 2022-09-14: 1000 mL via INTRAVENOUS

## 2022-09-14 MED ORDER — ONDANSETRON 4 MG PO TBDP
4.0000 mg | ORAL_TABLET | Freq: Once | ORAL | Status: AC
  Administered 2022-09-14: 4 mg via ORAL
  Filled 2022-09-14: qty 1

## 2022-09-14 NOTE — ED Provider Notes (Signed)
[place] EMERGENCY DEPARTMENT AT [facility] Provider Note   CSN: [id number] Arrival date & time: 09/14/22  1639     History  Chief Complaint  Patient presents with   Emesis    Rose Baker is a 37 y.o. female past medical history of anxiety presents to the emergency department for evaluation of nausea and vomiting.  She was seen yesterday for nausea and vomiting that began yesterday morning.  She had a negative workup, normal with CMP and slightly elevated WBCs at 14.2 with negative urinalysis/urine pregnancy test and was given IV fluids and droperidol and saw resolution of her symptoms.  This morning she began having more nausea severe repetitive vomiting that has been persistent throughout the day.  Patient has not tried Phenergan suppositories that were prescribed yesterday.  She presents today for continued nausea and vomiting.  No fevers chills chest pain shortness of breath abdominal pain or back pain.  No urinary symptoms.  Patient had similar episode of nausea and vomiting back in July, was seen at [facility] emergency department in [place] and had a negative CT scan of the abdomen/pelvis.  Patient does admit to using marijuana but last time she admits to using it was last week.  HPI     Home Medications Prior to Admission medications   Medication Sig Start Date End Date Taking? Authorizing Provider  metFORMIN (GLUCOPHAGE-XR) 500 MG 24 hr tablet Take 500 mg by mouth daily with breakfast. 05/18/22 05/18/23  [provider]  promethazine (PHENERGAN) 12.5 MG suppository Place 1 suppository (12.5 mg total) rectally every 6 (six) hours as needed for nausea or vomiting. 09/13/22   Minna Antis, MD  dicyclomine (BENTYL) 20 MG tablet Take 1 tablet (20 mg total) by mouth 3 (three) times daily as needed (abdominal pain). 02/11/16 12/25/18  Phineas Semen, MD      Allergies    Patient has no known allergies.    Review of Systems   Review of Systems  Physical  Exam Updated Vital Signs BP (!) 101/49 (BP Location: Left Arm)   Pulse 69   Temp 99.1 F (37.3 C) (Oral)   Resp 18   Ht 5\' 9"  (1.753 m)   Wt 95 kg   LMP 09/09/2022   SpO2 94%   BMI 30.93 kg/m  Physical Exam Constitutional:      Appearance: Normal appearance. She is well-developed.     Comments: Initially moderate distress, cyclic vomiting.  After medications, she is currently in no distress  HENT:     Head: Normocephalic and atraumatic.     Jaw: No trismus.     Right Ear: Hearing, ear canal and external ear normal.     Left Ear: Hearing, ear canal and external ear normal.     Nose: No rhinorrhea.     Mouth/Throat:     Mouth: Mucous membranes are normal.     Pharynx: Oropharynx is clear. No oropharyngeal exudate, posterior oropharyngeal edema, posterior oropharyngeal erythema or uvula swelling.     Tonsils: No tonsillar exudate or tonsillar abscesses.  Eyes:     General:        Right eye: No discharge.        Left eye: No discharge.     Conjunctiva/sclera: Conjunctivae normal.  Cardiovascular:     Rate and Rhythm: Normal rate and regular rhythm.     Pulses: Normal pulses.     Heart sounds: Normal heart sounds.  Pulmonary:     Effort: Pulmonary effort is normal. No  respiratory distress.     Breath sounds: Normal breath sounds. No stridor. No wheezing or rales.  Abdominal:     General: Abdomen is flat. Bowel sounds are normal. There is no distension.     Palpations: Abdomen is soft. There is no mass.     Tenderness: There is no abdominal tenderness. There is no right CVA tenderness, left CVA tenderness or guarding.  Musculoskeletal:        General: No deformity. Normal range of motion.     Cervical back: Normal range of motion.  Skin:    General: Skin is warm and dry.     Capillary Refill: Capillary refill takes less than 2 seconds.     Findings: No rash.  Neurological:     General: No focal deficit present.     Mental Status: She is alert and oriented to person,  place, and time. Mental status is at baseline.     Cranial Nerves: No cranial nerve deficit.     Gait: Gait normal.     Deep Tendon Reflexes: Reflexes are normal and symmetric.  Psychiatric:        Mood and Affect: Mood and affect and mood normal.        Behavior: Behavior normal.        Thought Content: Thought content normal.     ED Results / Procedures / Treatments   Labs (all labs ordered are listed, but only abnormal results are displayed) Labs Reviewed  CBC - Abnormal; Notable for the following components:      Result Value   WBC 11.2 (*)    All other components within normal limits  URINE DRUG SCREEN, QUALITATIVE ([facility] ONLY) - Abnormal; Notable for the following components:   Cannabinoid 50 Ng, Ur [place] POSITIVE (*)    All other components within normal limits  COMPREHENSIVE METABOLIC PANEL - Abnormal; Notable for the following components:   Potassium 2.5 (*)    Chloride 118 (*)    CO2 18 (*)    Glucose, Bld 102 (*)    Calcium 6.7 (*)    Total Protein 5.6 (*)    Albumin 3.4 (*)    Alkaline Phosphatase 36 (*)    All other components within normal limits  MAGNESIUM - Abnormal; Notable for the following components:   Magnesium 1.6 (*)    All other components within normal limits  URINALYSIS, COMPLETE (UACMP) WITH MICROSCOPIC  POC URINE PREG, ED    EKG None  Radiology No results found.  Procedures Procedures    Medications Ordered in ED Medications  potassium chloride 10 mEq in 100 mL IVPB (10 mEq Intravenous New Bag/Given 09/14/22 2235)  magnesium oxide (MAG-OX) tablet 400 mg (has no administration in time range)  sodium chloride 0.9 % bolus 1,000 mL (0 mLs Intravenous Stopped 09/14/22 1926)  promethazine (PHENERGAN) suppository 25 mg (25 mg Rectal Given 09/14/22 1804)  droperidol (INAPSINE) 2.5 MG/ML injection 2.5 mg (2.5 mg Intravenous Given 09/14/22 1914)  ondansetron (ZOFRAN-ODT) disintegrating tablet 4 mg (4 mg Oral Given 09/14/22 2106)  potassium chloride SA  (KLOR-CON M) CR tablet 20 mEq (20 mEq Oral Given 09/14/22 2242)  0.9 %  sodium chloride infusion ( Intravenous New Bag/Given 09/14/22 2234)    ED Course/ Medical Decision Making/ A&P                                 Medical Decision Making Amount and/or Complexity of  Data Reviewed Labs: ordered.  Risk Prescription drug management.   37 year old female with cyclic nausea and vomiting.  She was seen yesterday in the emergency department for the same symptoms and only received relief with droperidol.  She has been vomiting numerous times throughout the day, too many to count.  She has vomited several times in the room here in the emergency department.  EKG was obtained showing no QT prolongation, IV fluids and 2.5 mg of droperidol were given which seemed to improve her nausea and vomiting.  Patient has been able to tolerate some p.o. fluids here in the ED.  Blood work obtained today showed hypokalemia of 2.5 with slightly low magnesium of 1.6.  Patient was given oral and IV potassium repletion along with some IV fluids and oral magnesium repletion.  CBC shows slightly elevated white count of 11.2 but slightly improved from yesterday.  Urine pregnancy test negative.  Patient with no reports of abdominal pain, back pain, no tenderness on exam.  No need for imaging at this time.  Due to patient bouncing back in the emergency department with reoccurring symptoms and critically low potassium, will discuss admission with hospitalist.  Hospitalist agreeable to admission. Final Clinical Impression(s) / ED Diagnoses Final diagnoses:  Nausea and vomiting, unspecified vomiting type  Uncontrollable vomiting  Hypokalemia  Hypomagnesemia    Rx / DC Orders ED Discharge Orders     None         Evon Slack, PA-C 09/15/22 0000    Dionne Bucy, MD 09/15/22 2337

## 2022-09-14 NOTE — ED Notes (Signed)
Patient continues to vomit with no relief after receiving anti-nausea suppository.

## 2022-09-14 NOTE — ED Triage Notes (Signed)
Pt presents to ED with c/o of emesis. Pt seen yesterday for same. Pt has not picked up phenergan RX. Pt states can't keep anything down not even water. Pt vomiting in triage.

## 2022-09-15 DIAGNOSIS — F1290 Cannabis use, unspecified, uncomplicated: Secondary | ICD-10-CM | POA: Diagnosis present

## 2022-09-15 DIAGNOSIS — R001 Bradycardia, unspecified: Secondary | ICD-10-CM | POA: Diagnosis present

## 2022-09-15 DIAGNOSIS — R197 Diarrhea, unspecified: Secondary | ICD-10-CM | POA: Diagnosis present

## 2022-09-15 DIAGNOSIS — Z7984 Long term (current) use of oral hypoglycemic drugs: Secondary | ICD-10-CM | POA: Diagnosis not present

## 2022-09-15 DIAGNOSIS — E876 Hypokalemia: Secondary | ICD-10-CM | POA: Diagnosis present

## 2022-09-15 DIAGNOSIS — K2970 Gastritis, unspecified, without bleeding: Secondary | ICD-10-CM | POA: Diagnosis present

## 2022-09-15 DIAGNOSIS — E669 Obesity, unspecified: Secondary | ICD-10-CM | POA: Insufficient documentation

## 2022-09-15 DIAGNOSIS — R112 Nausea with vomiting, unspecified: Secondary | ICD-10-CM | POA: Diagnosis present

## 2022-09-15 DIAGNOSIS — Z6830 Body mass index (BMI) 30.0-30.9, adult: Secondary | ICD-10-CM | POA: Diagnosis not present

## 2022-09-15 DIAGNOSIS — Z83719 Family history of colon polyps, unspecified: Secondary | ICD-10-CM | POA: Diagnosis not present

## 2022-09-15 DIAGNOSIS — R109 Unspecified abdominal pain: Secondary | ICD-10-CM

## 2022-09-15 DIAGNOSIS — Z833 Family history of diabetes mellitus: Secondary | ICD-10-CM | POA: Diagnosis not present

## 2022-09-15 DIAGNOSIS — R1013 Epigastric pain: Secondary | ICD-10-CM | POA: Diagnosis not present

## 2022-09-15 DIAGNOSIS — R1110 Vomiting, unspecified: Secondary | ICD-10-CM | POA: Diagnosis present

## 2022-09-15 DIAGNOSIS — K2900 Acute gastritis without bleeding: Secondary | ICD-10-CM | POA: Diagnosis not present

## 2022-09-15 DIAGNOSIS — Z3202 Encounter for pregnancy test, result negative: Secondary | ICD-10-CM | POA: Diagnosis present

## 2022-09-15 DIAGNOSIS — Z800 Family history of malignant neoplasm of digestive organs: Secondary | ICD-10-CM | POA: Diagnosis not present

## 2022-09-15 DIAGNOSIS — Z806 Family history of leukemia: Secondary | ICD-10-CM | POA: Diagnosis not present

## 2022-09-15 DIAGNOSIS — R1010 Upper abdominal pain, unspecified: Secondary | ICD-10-CM

## 2022-09-15 DIAGNOSIS — K760 Fatty (change of) liver, not elsewhere classified: Secondary | ICD-10-CM | POA: Diagnosis present

## 2022-09-15 DIAGNOSIS — E872 Acidosis, unspecified: Secondary | ICD-10-CM | POA: Diagnosis present

## 2022-09-15 DIAGNOSIS — F419 Anxiety disorder, unspecified: Secondary | ICD-10-CM | POA: Diagnosis present

## 2022-09-15 DIAGNOSIS — K209 Esophagitis, unspecified without bleeding: Secondary | ICD-10-CM | POA: Diagnosis present

## 2022-09-15 DIAGNOSIS — F17210 Nicotine dependence, cigarettes, uncomplicated: Secondary | ICD-10-CM | POA: Diagnosis present

## 2022-09-15 LAB — CBC
HCT: 33.8 % — ABNORMAL LOW (ref 36.0–46.0)
Hemoglobin: 11 g/dL — ABNORMAL LOW (ref 12.0–15.0)
MCH: 29 pg (ref 26.0–34.0)
MCHC: 32.5 g/dL (ref 30.0–36.0)
MCV: 89.2 fL (ref 80.0–100.0)
Platelets: 281 10*3/uL (ref 150–400)
RBC: 3.79 MIL/uL — ABNORMAL LOW (ref 3.87–5.11)
RDW: 12.6 % (ref 11.5–15.5)
WBC: 8.4 10*3/uL (ref 4.0–10.5)
nRBC: 0 % (ref 0.0–0.2)

## 2022-09-15 LAB — BASIC METABOLIC PANEL
Anion gap: 8 (ref 5–15)
BUN: 16 mg/dL (ref 6–20)
CO2: 20 mmol/L — ABNORMAL LOW (ref 22–32)
Calcium: 8.2 mg/dL — ABNORMAL LOW (ref 8.9–10.3)
Chloride: 112 mmol/L — ABNORMAL HIGH (ref 98–111)
Creatinine, Ser: 0.73 mg/dL (ref 0.44–1.00)
GFR, Estimated: >60 mL/min (ref >60)
Glucose, Bld: 101 mg/dL — ABNORMAL HIGH (ref 70–99)
Potassium: 3.5 mmol/L (ref 3.5–5.1)
Sodium: 140 mmol/L (ref 135–145)

## 2022-09-15 LAB — URINALYSIS, COMPLETE (UACMP) WITH MICROSCOPIC
Bacteria, UA: NONE SEEN
Bilirubin Urine: NEGATIVE
Glucose, UA: NEGATIVE mg/dL
Hgb urine dipstick: NEGATIVE
Ketones, ur: 80 mg/dL — AB
Leukocytes,Ua: NEGATIVE
Nitrite: NEGATIVE
Protein, ur: 30 mg/dL — AB
Specific Gravity, Urine: 1.03 (ref 1.005–1.030)
pH: 6 (ref 5.0–8.0)

## 2022-09-15 LAB — PHOSPHORUS: Phosphorus: 2.2 mg/dL — ABNORMAL LOW (ref 2.5–4.6)

## 2022-09-15 LAB — HIV ANTIBODY (ROUTINE TESTING W REFLEX): HIV Screen 4th Generation wRfx: NONREACTIVE

## 2022-09-15 LAB — MAGNESIUM: Magnesium: 2.6 mg/dL — ABNORMAL HIGH (ref 1.7–2.4)

## 2022-09-15 MED ORDER — ACETAMINOPHEN 325 MG PO TABS: 650.0000 mg | ORAL_TABLET | Freq: Four times a day (QID) | ORAL | Status: DC | PRN

## 2022-09-15 MED ORDER — ONDANSETRON HCL 4 MG PO TABS
4.0000 mg | ORAL_TABLET | Freq: Four times a day (QID) | ORAL | Status: DC | PRN
  Administered 2022-09-18 – 2022-09-19 (×3): 4 mg via ORAL
  Filled 2022-09-15 (×4): qty 1

## 2022-09-15 MED ORDER — POTASSIUM & SODIUM PHOSPHATES 280-160-250 MG PO PACK
2.0000 | PACK | Freq: Every day | ORAL | Status: AC
  Administered 2022-09-15: 2 via ORAL
  Filled 2022-09-15: qty 2

## 2022-09-15 MED ORDER — CALCIUM GLUCONATE-NACL 1-0.675 GM/50ML-% IV SOLN
1.0000 g | Freq: Once | INTRAVENOUS | Status: AC
  Administered 2022-09-15: 1000 mg via INTRAVENOUS
  Filled 2022-09-15: qty 50

## 2022-09-15 MED ORDER — ACETAMINOPHEN 650 MG RE SUPP: 650.0000 mg | Freq: Four times a day (QID) | RECTAL | Status: DC | PRN

## 2022-09-15 MED ORDER — ONDANSETRON HCL 4 MG/2ML IJ SOLN
4.0000 mg | Freq: Four times a day (QID) | INTRAMUSCULAR | Status: DC | PRN
  Administered 2022-09-15 – 2022-09-21 (×16): 4 mg via INTRAVENOUS
  Filled 2022-09-15 (×16): qty 2

## 2022-09-15 MED ORDER — MAGNESIUM SULFATE 2 GM/50ML IV SOLN
2.0000 g | Freq: Once | INTRAVENOUS | Status: AC
  Administered 2022-09-15: 2 g via INTRAVENOUS
  Filled 2022-09-15: qty 50

## 2022-09-15 MED ORDER — METOCLOPRAMIDE HCL 5 MG/ML IJ SOLN
10.0000 mg | Freq: Four times a day (QID) | INTRAMUSCULAR | Status: AC
  Administered 2022-09-15 (×4): 10 mg via INTRAVENOUS
  Filled 2022-09-15 (×4): qty 2

## 2022-09-15 MED ORDER — SODIUM CHLORIDE 0.9 % IV SOLN
12.5000 mg | Freq: Four times a day (QID) | INTRAVENOUS | Status: DC | PRN
  Administered 2022-09-16 – 2022-09-21 (×15): 12.5 mg via INTRAVENOUS
  Filled 2022-09-15: qty 12.5
  Filled 2022-09-15 (×2): qty 0.5
  Filled 2022-09-15: qty 12.5
  Filled 2022-09-15 (×2): qty 0.5
  Filled 2022-09-15 (×4): qty 12.5
  Filled 2022-09-15 (×2): qty 0.5
  Filled 2022-09-15 (×4): qty 12.5

## 2022-09-15 MED ORDER — ENOXAPARIN SODIUM 60 MG/0.6ML IJ SOSY
50.0000 mg | PREFILLED_SYRINGE | INTRAMUSCULAR | Status: DC
  Filled 2022-09-15: qty 0.6

## 2022-09-15 MED ORDER — LACTATED RINGERS IV SOLN: INTRAVENOUS | Status: DC

## 2022-09-15 MED ORDER — PANTOPRAZOLE SODIUM 40 MG IV SOLR
40.0000 mg | Freq: Two times a day (BID) | INTRAVENOUS | Status: DC
  Administered 2022-09-15 – 2022-09-21 (×13): 40 mg via INTRAVENOUS
  Filled 2022-09-15 (×13): qty 10

## 2022-09-15 MED ORDER — POTASSIUM CHLORIDE 10 MEQ/100ML IV SOLN
10.0000 meq | INTRAVENOUS | Status: DC
  Administered 2022-09-15: 10 meq via INTRAVENOUS
  Filled 2022-09-15: qty 100

## 2022-09-15 MED ORDER — LORAZEPAM 0.5 MG PO TABS
0.5000 mg | ORAL_TABLET | Freq: Once | ORAL | Status: AC
  Administered 2022-09-15: 0.5 mg via ORAL
  Filled 2022-09-15: qty 1

## 2022-09-15 NOTE — Assessment & Plan Note (Addendum)
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  Trial of capsaicin cream.  Continue Protonix 40 mg IV twice daily because this could be acid reflux/esophagitis.  With slight blood could have a Mallory-Weiss tear.  Vomited overnight.  Abdominal x-ray negative.  Made n.p.o. for CT scan.

## 2022-09-15 NOTE — Progress Notes (Signed)
PHARMACIST - PHYSICIAN COMMUNICATION  CONCERNING:  Enoxaparin (Lovenox) for DVT Prophylaxis    RECOMMENDATION: Patient was prescribed enoxaprin 40mg  q24 hours for VTE prophylaxis.   Filed Weights   09/14/22 1647  Weight: 95 kg (209 lb 7 oz)    Body mass index is 30.93 kg/m.  Estimated Creatinine Clearance: 120.4 mL/min (by C-G formula based on SCr of 0.51 mg/dL).   Based on [facility] policy patient is candidate for enoxaparin 0.5mg /kg TBW SQ every 24 hours based on BMI being >30.  DESCRIPTION: Pharmacy has adjusted enoxaparin dose per [facility] policy.  Patient is now receiving enoxaparin 0.5 mg/kg every 24 hours   Otelia Sergeant, PharmD, [facility] 09/15/2022 12:18 AM

## 2022-09-15 NOTE — Assessment & Plan Note (Addendum)
Replaced

## 2022-09-15 NOTE — Assessment & Plan Note (Signed)
Replaced. °

## 2022-09-15 NOTE — Progress Notes (Signed)
Messaged Duncan MD via secure chat to review orders since patient's Reglan IV was expiring after this last PM dose. Patient has had no emesis since arriving to floor and has minimal to no nausea. Tolerated full liquid diet of pudding, jello, and juice. Patient asked why she can't eat after midnight, but MD notes from today are not clear on rationale since she has tolerated a full liquid diet for dinner. Asked MD to clarify plan of care, but MD declined to change any orders for tonight.   Patient updated on plan of care. Plan is to administer scheduled meds as ordered and use PRN orders appropriately to manage emesis/nausea/reflux. Patient remains on cardiac monitor. MD made aware patient still in sinus bradycardia at rest. Improving PO intake today prior to NPO at midnight except sips with medications.

## 2022-09-15 NOTE — H&P (Signed)
History and Physical    Patient: Rose Baker [id number] DOB: 06-Jun-1985 DOA: 09/14/2022 DOS: the patient was seen and examined on 09/15/2022 PCP: Margaretann Loveless, MD  Patient coming from: Home  Chief Complaint:  Chief Complaint  Patient presents with   Emesis    HPI: Rose Baker is a 37 y.o. female with medical history significant for Anxiety, who presents to the ED for the second time in 24 hours with intractable vomiting.  She denies abdominal pain, diarrhea, dysuria, fever or chills.  On her first visit she was treated and discharged on Phenergan suppositories which she did not fill until today however, on the morning of arrival she awoke vomiting and has been intractable all day prompting the second visit to the ED. ED course and data review: Temp 99.4.  BP initially normal but dipped as low as 101/49.  Other vitals within normal limits. Labs notable for potassium of 2.5, calcium 6.7 and magnesium 1.6, all of which were normal the day prior except for magnesium which was not measured.  CBC notable for WBC of 11,000.  LFTs WNL.  UDS positive for cannabinoids. No imaging done but patient had CT abdomen and pelvis done 07/18/2022 when she presented to the ED with similar symptoms and it showed hepatic steatosis but no acute intra-abdominal process. Patient treated with Phenergan suppository which did not help and subsequently given droperidol. Electrolyte repletion was done with oral potassium and magnesium She received an IV fluid bolus Hospitalist consulted for admission.   Review of Systems: As mentioned in the history of present illness. All other systems reviewed and are negative.  Past Medical History:  Diagnosis Date   Anxiety    Depression    Iron deficiency anemia due to chronic blood loss 08/12/2018   Past Surgical History:  Procedure Laterality Date   CHOLECYSTECTOMY     WISDOM TOOTH EXTRACTION     Social History:  reports that she has been smoking cigarettes. She  has never used smokeless tobacco. She reports current alcohol use of about 2.0 standard drinks of alcohol per week. She reports that she does not use drugs.  No Known Allergies  Family History  Problem Relation Age of Onset   Healthy Mother    Diabetes Father    Leukemia Maternal Aunt    Colon cancer Maternal Grandmother    Breast cancer Maternal Grandmother     Prior to Admission medications   Medication Sig Start Date End Date Taking? Authorizing Provider  metFORMIN (GLUCOPHAGE-XR) 500 MG 24 hr tablet Take 500 mg by mouth daily with breakfast. 05/18/22 05/18/23  [provider]  promethazine (PHENERGAN) 12.5 MG suppository Place 1 suppository (12.5 mg total) rectally every 6 (six) hours as needed for nausea or vomiting. 09/13/22   Minna Antis, MD  dicyclomine (BENTYL) 20 MG tablet Take 1 tablet (20 mg total) by mouth 3 (three) times daily as needed (abdominal pain). 02/11/16 12/25/18  Phineas Semen, MD    Physical Exam: Vitals:   09/14/22 1647 09/14/22 2250  BP: 133/67 (!) 101/49  Pulse: 62 69  Resp: 20 18  Temp: 99.4 F (37.4 C) 99.1 F (37.3 C)  TempSrc: Oral Oral  SpO2: 100% 94%  Weight: 95 kg   Height: 5\' 9"  (1.753 m)    Physical Exam Vitals and nursing note reviewed.  Constitutional:      General: She is not in acute distress.    Comments: Actively vomiting  HENT:     Head: Normocephalic and  atraumatic.  Cardiovascular:     Rate and Rhythm: Normal rate and regular rhythm.     Heart sounds: Normal heart sounds.  Pulmonary:     Effort: Pulmonary effort is normal.     Breath sounds: Normal breath sounds.  Abdominal:     Palpations: Abdomen is soft.     Tenderness: There is no abdominal tenderness.  Neurological:     Mental Status: Mental status is at baseline.     Labs on Admission: I have personally reviewed following labs and imaging studies  CBC: Recent Labs  Lab 09/13/22 1816 09/14/22 1746  WBC 14.2* 11.2*  HGB 13.1 12.5  HCT 40.1  38.0  MCV 87.2 88.0  PLT 371 361   Basic Metabolic Panel: Recent Labs  Lab 09/13/22 1816 09/14/22 2106  NA 140 143  K 4.0 2.5*  CL 107 118*  CO2 21* 18*  GLUCOSE 147* 102*  BUN 14 15  CREATININE 0.75 0.51  CALCIUM 9.5 6.7*  MG  --  1.6*   GFR: Estimated Creatinine Clearance: 120.4 mL/min (by C-G formula based on SCr of 0.51 mg/dL). Liver Function Tests: Recent Labs  Lab 09/13/22 1816 09/14/22 2106  AST 23 18  ALT 16 13  ALKPHOS 60 36*  BILITOT 0.8 0.7  PROT 8.5* 5.6*  ALBUMIN 4.8 3.4*   Recent Labs  Lab 09/13/22 1816  LIPASE 29   No results for input(s): "AMMONIA" in the last 168 hours. Coagulation Profile: No results for input(s): "INR", "PROTIME" in the last 168 hours. Cardiac Enzymes: No results for input(s): "CKTOTAL", "CKMB", "CKMBINDEX", "TROPONINI" in the last 168 hours. BNP (last 3 results) No results for input(s): "PROBNP" in the last 8760 hours. HbA1C: No results for input(s): "HGBA1C" in the last 72 hours. CBG: No results for input(s): "GLUCAP" in the last 168 hours. Lipid Profile: No results for input(s): "CHOL", "HDL", "LDLCALC", "TRIG", "CHOLHDL", "LDLDIRECT" in the last 72 hours. Thyroid Function Tests: No results for input(s): "TSH", "T4TOTAL", "FREET4", "T3FREE", "THYROIDAB" in the last 72 hours. Anemia Panel: No results for input(s): "VITAMINB12", "FOLATE", "FERRITIN", "TIBC", "IRON", "RETICCTPCT" in the last 72 hours. Urine analysis:    Component Value Date/Time   COLORURINE YELLOW (A) 09/13/2022 1815   APPEARANCEUR HAZY (A) 09/13/2022 1815   APPEARANCEUR Clear 07/12/2013 2235   LABSPEC 1.032 (H) 09/13/2022 1815   LABSPEC 1.026 07/12/2013 2235   PHURINE 6.0 09/13/2022 1815   GLUCOSEU NEGATIVE 09/13/2022 1815   GLUCOSEU Negative 07/12/2013 2235   HGBUR MODERATE (A) 09/13/2022 1815   BILIRUBINUR NEGATIVE 09/13/2022 1815   BILIRUBINUR Negative 07/12/2013 2235   KETONESUR 80 (A) 09/13/2022 1815   PROTEINUR 100 (A) 09/13/2022 1815    NITRITE NEGATIVE 09/13/2022 1815   LEUKOCYTESUR NEGATIVE 09/13/2022 1815   LEUKOCYTESUR Negative 07/12/2013 2235    Radiological Exams on Admission: No results found.   Data Reviewed: Relevant notes from primary care and specialist visits, past discharge summaries as available in EHR, including Care Everywhere. Prior diagnostic testing as pertinent to current admission diagnoses Updated medications and problem lists for reconciliation ED course, including vitals, labs, imaging, treatment and response to treatment Triage notes, nursing and pharmacy notes and ED provider's notes Notable results as noted in HPI   Assessment and Plan: * Intractable vomiting Patient having recurrent episodes and recurrent visits to the ED for intractable vomiting, etiology uncertain Given UDS positive for cannabinoids, differential includes hyperemesis related to cannabinoid Will keep n.p.o. overnight except for ice chips, then trial of clears in the morning  IV hydration,  Phenergan as needed Reglan every 6 x 24 hours  Hypokalemia Hypocalcemia and hypomagnesemia IV repletion ordered Pharmacy consulted for ongoing electrolyte management Continuous cardiac monitoring due to hypokalemia of 2.5    DVT prophylaxis: Lovenox  Consults: none  Advance Care Planning: full code  Family Communication: none  Disposition Plan: Back to previous home environment  Severity of Illness: The appropriate patient status for this patient is OBSERVATION. Observation status is judged to be reasonable and necessary in order to provide the required intensity of service to ensure the patient's safety. The patient's presenting symptoms, physical exam findings, and initial radiographic and laboratory data in the context of their medical condition is felt to place them at decreased risk for further clinical deterioration. Furthermore, it is anticipated that the patient will be medically stable for discharge from the  hospital within 2 midnights of admission.   Author: Andris Baumann, MD 09/15/2022 12:08 AM  For on call review [email or web address].

## 2022-09-15 NOTE — Assessment & Plan Note (Signed)
BMI listed as 30.93.

## 2022-09-15 NOTE — Assessment & Plan Note (Signed)
Improved with replacement  

## 2022-09-15 NOTE — Assessment & Plan Note (Signed)
Still having some pain from vomiting.

## 2022-09-15 NOTE — Progress Notes (Signed)
Progress Note   Patient: Rose Baker [id number] DOB: July 03, 1985 DOA: 09/14/2022     0 DOS: the patient was seen and examined on 09/15/2022   Brief hospital course: 37 y.o. female with medical history significant for Anxiety, who presents to the ED for the second time in 24 hours with intractable vomiting.  She denies abdominal pain, diarrhea, dysuria, fever or chills.  On her first visit she was treated and discharged on Phenergan suppositories which she did not fill until today however, on the morning of arrival she awoke vomiting and has been intractable all day prompting the second visit to the ED. ED course and data review: Temp 99.4.  BP initially normal but dipped as low as 101/49.  Other vitals within normal limits. Labs notable for potassium of 2.5, calcium 6.7 and magnesium 1.6, all of which were normal the day prior except for magnesium which was not measured.  CBC notable for WBC of 11,000.  LFTs WNL.  UDS positive for cannabinoids. No imaging done but patient had CT abdomen and pelvis done 07/18/2022 when she presented to the ED with similar symptoms and it showed hepatic steatosis but no acute intra-abdominal process. Patient treated with Phenergan suppository which did not help and subsequently given droperidol. Electrolyte repletion was done with oral potassium and magnesium She received an IV fluid bolus Hospitalist consulted for admission.   8/31.  Patient has this feeling in her lower throat that she feels may be triggering her to vomit.  Multiple repeated episodes of vomiting with slight blood.  Advised to stop using cannabis.  Protonix ordered IV.   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  Advised to stop using cannabis altogether.  Patient states she uses sporadically.  Supportive care with IV fluids, electrolyte replacement, as needed nausea medications.  Start Protonix 40 mg IV twice daily because this could be acid reflux.   With slight blood could have a Mallory-Weiss tear.  Advance to full liquid diet today.  Reassess tomorrow morning to see if she is well enough to try to start solid food or whether we need to do further testing.  Abdominal pain Secondary to repeated vomiting.  Hypokalemia Replaced  Hypomagnesemia Replaced  Hypocalcemia Improved with replacement  Hypophosphatemia Phosphorus oral replacement.  Obesity (BMI 30-39.9) BMI listed as 30.93.        Subjective: Patient still having abdominal discomfort.  Had quite a bit of vomiting prior to coming into the hospital and while here.  Nervous about liquid diet.  Admitted with intractable vomiting  Physical Exam: Vitals:   09/15/22 0607 09/15/22 0900 09/15/22 1300 09/15/22 1619  BP: 110/70 118/75 126/82 (!) 133/95  Pulse: 68 70 72 (!) 52  Resp: 16 18 20 19   Temp: 98.3 F (36.8 C) 98.7 F (37.1 C) 98.4 F (36.9 C) 98.3 F (36.8 C)  TempSrc:  Oral Oral Oral  SpO2: 96% 98% 100% 100%  Weight:      Height:       Physical Exam HENT:     Head: Normocephalic.     Mouth/Throat:     Pharynx: No oropharyngeal exudate.  Eyes:     General: Lids are normal.     Conjunctiva/sclera: Conjunctivae normal.  Cardiovascular:     Rate and Rhythm: Normal rate and regular rhythm.     Heart sounds: Normal heart sounds, S1 normal and S2 normal.  Pulmonary:     Breath sounds: No decreased breath sounds, wheezing, rhonchi or rales.  Abdominal:     Tenderness: There is abdominal tenderness in the right upper quadrant and epigastric area.  Musculoskeletal:     Right lower leg: No swelling.     Left lower leg: No swelling.  Skin:    General: Skin is warm.     Findings: No rash.  Neurological:     Mental Status: She is alert and oriented to person, place, and time.     Data Reviewed: Potassium is 3.5, creatinine 0.73, phosphorus 2.2, magnesium 2.6  Family Communication: Declined  Disposition: Status is: Inpatient Remains inpatient  appropriate because: Full liquid diet today reassess tomorrow morning on whether we can advance the diet versus further testing.  Planned Discharge Destination: Home    Time spent: 28 minutes  Author: Alford Highland, MD 09/15/2022 4:25 PM  For on call review [email or web address].

## 2022-09-15 NOTE — Hospital Course (Addendum)
37 y.o. female with medical history significant for Anxiety, who presents to the ED for the second time in 24 hours with intractable vomiting.     Labs notable for potassium of 2.5, calcium 6.7 and magnesium 1.6.  UDS positive for cannabinoids. No imaging done but patient had CT abdomen and pelvis done 07/18/2022 when she presented to the ED with similar symptoms and it showed hepatic steatosis but no acute intra-abdominal process. Patient treated with Phenergan suppository which did not help and subsequently given droperidol. She is treated with IV fluids and symptomatic treatment.  EKG performed 9/4 showed gastritis and esophagitis due to vomiting.  Patient condition finally improved today, tolerating diet, nausea vomiting much better.  Medically stable for discharge.

## 2022-09-15 NOTE — Consult Note (Signed)
PHARMACY CONSULT NOTE - ELECTROLYTES  Pharmacy Consult for Electrolyte Monitoring and Replacement   Recent Labs: Height: 5\' 9"  (175.3 cm) Weight: 95 kg (209 lb 7 oz) IBW/kg (Calculated) : 66.2 Estimated Creatinine Clearance: 120.4 mL/min (by C-G formula based on SCr of 0.73 mg/dL). Potassium (mmol/L)  Date Value  09/15/2022 3.5  07/12/2013 3.9   Magnesium (mg/dL)  Date Value  82/95/6213 2.6 (H)   Calcium (mg/dL)  Date Value  08/65/7846 8.2 (L)   Calcium, Total (mg/dL)  Date Value  96/29/5284 8.6   Albumin (g/dL)  Date Value  13/24/4010 3.4 (L)  07/12/2013 3.9   Phosphorus (mg/dL)  Date Value  27/25/3664 2.2 (L)   Sodium (mmol/L)  Date Value  09/15/2022 140  07/12/2013 137   Corrected Ca: 8.9 mg/dL  Assessment  Rose Baker is a 37 y.o. female presenting with intractable nausea/vomiting. PMH significant for cannabinoid use. Pharmacy has been consulted to monitor and replace electrolytes.  Diet: NPO sip with meds MIVF: LR @ 125 mL/hr Pertinent medications:   Goal of Therapy: Electrolytes WNL  Plan:  PO Phos-Nak 2 packets PO x 1  Check BMP, Mg, Phos with AM labs  Thank you for allowing pharmacy to be a part of this patient's care.  Sharen Hones, PharmD, BCPS Clinical Pharmacist   09/15/2022 7:34 AM

## 2022-09-16 DIAGNOSIS — E876 Hypokalemia: Secondary | ICD-10-CM | POA: Diagnosis not present

## 2022-09-16 DIAGNOSIS — R1013 Epigastric pain: Secondary | ICD-10-CM | POA: Diagnosis not present

## 2022-09-16 DIAGNOSIS — R001 Bradycardia, unspecified: Secondary | ICD-10-CM | POA: Insufficient documentation

## 2022-09-16 DIAGNOSIS — R1110 Vomiting, unspecified: Secondary | ICD-10-CM | POA: Diagnosis not present

## 2022-09-16 LAB — COMPREHENSIVE METABOLIC PANEL
ALT: 26 U/L (ref 0–44)
AST: 29 U/L (ref 15–41)
Albumin: 3.9 g/dL (ref 3.5–5.0)
Alkaline Phosphatase: 38 U/L (ref 38–126)
Anion gap: 6 (ref 5–15)
BUN: 8 mg/dL (ref 6–20)
CO2: 23 mmol/L (ref 22–32)
Calcium: 8.5 mg/dL — ABNORMAL LOW (ref 8.9–10.3)
Chloride: 109 mmol/L (ref 98–111)
Creatinine, Ser: 0.84 mg/dL (ref 0.44–1.00)
GFR, Estimated: >60 mL/min (ref >60)
Glucose, Bld: 98 mg/dL (ref 70–99)
Potassium: 3.7 mmol/L (ref 3.5–5.1)
Sodium: 138 mmol/L (ref 135–145)
Total Bilirubin: 0.7 mg/dL (ref 0.3–1.2)
Total Protein: 6.4 g/dL — ABNORMAL LOW (ref 6.5–8.1)

## 2022-09-16 LAB — PHOSPHORUS: Phosphorus: 3.8 mg/dL (ref 2.5–4.6)

## 2022-09-16 MED ORDER — CAPSAICIN 0.025 % EX CREA
TOPICAL_CREAM | Freq: Two times a day (BID) | CUTANEOUS | Status: DC
  Filled 2022-09-16: qty 1

## 2022-09-16 MED ORDER — LORAZEPAM 2 MG/ML IJ SOLN
0.5000 mg | Freq: Once | INTRAMUSCULAR | Status: AC
  Administered 2022-09-16: 0.5 mg via INTRAVENOUS
  Filled 2022-09-16: qty 1

## 2022-09-16 NOTE — Assessment & Plan Note (Signed)
Asymptomatic. 

## 2022-09-16 NOTE — Progress Notes (Signed)
Progress Note   Patient: Rose Baker [id number] DOB: 26-Feb-1985 DOA: 09/14/2022     1 DOS: the patient was seen and examined on 09/16/2022   Brief hospital course: 37 y.o. female with medical history significant for Anxiety, who presents to the ED for the second time in 24 hours with intractable vomiting.  She denies abdominal pain, diarrhea, dysuria, fever or chills.  On her first visit she was treated and discharged on Phenergan suppositories which she did not fill until today however, on the morning of arrival she awoke vomiting and has been intractable all day prompting the second visit to the ED. ED course and data review: Temp 99.4.  BP initially normal but dipped as low as 101/49.  Other vitals within normal limits. Labs notable for potassium of 2.5, calcium 6.7 and magnesium 1.6, all of which were normal the day prior except for magnesium which was not measured.  CBC notable for WBC of 11,000.  LFTs WNL.  UDS positive for cannabinoids. No imaging done but patient had CT abdomen and pelvis done 07/18/2022 when she presented to the ED with similar symptoms and it showed hepatic steatosis but no acute intra-abdominal process. Patient treated with Phenergan suppository which did not help and subsequently given droperidol. Electrolyte repletion was done with oral potassium and magnesium She received an IV fluid bolus Hospitalist consulted for admission.   8/31.  Patient has this feeling in her lower throat that she feels may be triggering her to vomit.  Multiple repeated episodes of vomiting with slight blood.  Advised to stop using cannabis.  Protonix ordered IV. 9/1.  Patient felt better this morning when I saw her and we tried to advance diet.  Patient vomited with trying to eat breakfast.  Placed back on clear liquid diet.  Capsaicin cream ordered for abdomen.  Conservative management.  Case discussed with GI and recommending continued conservative management.  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  Trial of capsaicin cream.  Continue Protonix 40 mg IV twice daily because this could be acid reflux/esophagitis.  With slight blood could have a Mallory-Weiss tear.  Vomited after breakfast today go back to clear liquid diet.  Abdominal pain Improved from yesterday  Hypokalemia Replaced  Hypomagnesemia Replaced  Hypocalcemia Improved with replacement  Sinus bradycardia Asymptomatic  Hypophosphatemia Replaced  Obesity (BMI 30-39.9) BMI listed as 30.93.        Subjective: Patient feels better this morning and we try to advance diet.  Vomited with trying to eat breakfast.  Went back to clear liquid diet.  Continue to monitor.  Physical Exam: Vitals:   09/16/22 0400 09/16/22 0500 09/16/22 0739 09/16/22 1144  BP:   119/70 135/89  Pulse: (!) 53 (!) 53 61 (!) 48  Resp: (!) 22 (!) 22 18 18   Temp:   99.4 F (37.4 C) 98.9 F (37.2 C)  TempSrc:   Oral Oral  SpO2: 93% 92% 97% 99%  Weight:      Height:       Physical Exam HENT:     Head: Normocephalic.     Mouth/Throat:     Pharynx: No oropharyngeal exudate.  Eyes:     General: Lids are normal.     Conjunctiva/sclera: Conjunctivae normal.  Cardiovascular:     Rate and Rhythm: Normal rate and regular rhythm.     Heart sounds: Normal heart sounds, S1 normal and S2 normal.  Pulmonary:     Breath sounds: No decreased breath  sounds, wheezing, rhonchi or rales.  Abdominal:     Tenderness: There is no abdominal tenderness.  Musculoskeletal:     Right lower leg: No swelling.     Left lower leg: No swelling.  Skin:    General: Skin is warm.     Findings: No rash.  Neurological:     Mental Status: She is alert and oriented to person, place, and time.     Data Reviewed: Potassium 3.7, creatinine 0.84, phosphorus 3.8   Disposition: Status is: Inpatient Remains inpatient appropriate because: Vomited after trying to eat breakfast.  Placed back on clear  liquid diet.  Continue conservative management with IV Protonix and as needed nausea medication.  Planned Discharge Destination: Home    Time spent: 28 minutes  Author: Alford Highland, MD 09/16/2022 1:08 PM  For on call review [email or web address].

## 2022-09-16 NOTE — Consult Note (Signed)
PHARMACY CONSULT NOTE - ELECTROLYTES  Pharmacy Consult for Electrolyte Monitoring and Replacement   Recent Labs: Height: 5\' 9"  (175.3 cm) Weight: 95 kg (209 lb 7 oz) IBW/kg (Calculated) : 66.2 Estimated Creatinine Clearance: 114.7 mL/min (by C-G formula based on SCr of 0.84 mg/dL). Potassium (mmol/L)  Date Value  09/16/2022 3.7  07/12/2013 3.9   Magnesium (mg/dL)  Date Value  78/29/5621 2.6 (H)   Calcium (mg/dL)  Date Value  30/86/5784 8.5 (L)   Calcium, Total (mg/dL)  Date Value  69/62/9528 8.6   Albumin (g/dL)  Date Value  41/32/4401 3.9  07/12/2013 3.9   Phosphorus (mg/dL)  Date Value  02/72/5366 3.8   Sodium (mmol/L)  Date Value  09/16/2022 138  07/12/2013 137    Assessment  Rose Baker is a 37 y.o. female presenting with intractable nausea/vomiting. PMH significant for cannabinoid use. Pharmacy has been consulted to monitor and replace electrolytes.  Diet: NPO sip with meds MIVF: currently off Pertinent medications:   Goal of Therapy: Electrolytes WNL  Plan:  No replacement indicated today Check BMP, Mg, Phos tomorrow with AM labs  Thank you for allowing pharmacy to be a part of this patient's care.  Bettey Costa, PharmD Clinical Pharmacist   09/16/2022 8:26 AM

## 2022-09-17 ENCOUNTER — Inpatient Hospital Stay: Payer: Medicaid Other

## 2022-09-17 DIAGNOSIS — R1110 Vomiting, unspecified: Secondary | ICD-10-CM

## 2022-09-17 DIAGNOSIS — R1013 Epigastric pain: Secondary | ICD-10-CM | POA: Diagnosis not present

## 2022-09-17 DIAGNOSIS — E876 Hypokalemia: Secondary | ICD-10-CM

## 2022-09-17 DIAGNOSIS — E669 Obesity, unspecified: Secondary | ICD-10-CM

## 2022-09-17 DIAGNOSIS — R001 Bradycardia, unspecified: Secondary | ICD-10-CM

## 2022-09-17 LAB — COMPREHENSIVE METABOLIC PANEL
ALT: 48 U/L — ABNORMAL HIGH (ref 0–44)
AST: 42 U/L — ABNORMAL HIGH (ref 15–41)
Albumin: 3.8 g/dL (ref 3.5–5.0)
Alkaline Phosphatase: 43 U/L (ref 38–126)
Anion gap: 7 (ref 5–15)
BUN: 8 mg/dL (ref 6–20)
CO2: 22 mmol/L (ref 22–32)
Calcium: 8.2 mg/dL — ABNORMAL LOW (ref 8.9–10.3)
Chloride: 110 mmol/L (ref 98–111)
Creatinine, Ser: 0.76 mg/dL (ref 0.44–1.00)
GFR, Estimated: >60 mL/min (ref >60)
Glucose, Bld: 111 mg/dL — ABNORMAL HIGH (ref 70–99)
Potassium: 3.4 mmol/L — ABNORMAL LOW (ref 3.5–5.1)
Sodium: 139 mmol/L (ref 135–145)
Total Bilirubin: 1 mg/dL (ref 0.3–1.2)
Total Protein: 6.2 g/dL — ABNORMAL LOW (ref 6.5–8.1)

## 2022-09-17 LAB — CBC
HCT: 36.5 % (ref 36.0–46.0)
Hemoglobin: 12 g/dL (ref 12.0–15.0)
MCH: 28.6 pg (ref 26.0–34.0)
MCHC: 32.9 g/dL (ref 30.0–36.0)
MCV: 86.9 fL (ref 80.0–100.0)
Platelets: 316 10*3/uL (ref 150–400)
RBC: 4.2 MIL/uL (ref 3.87–5.11)
RDW: 12 % (ref 11.5–15.5)
WBC: 10.1 10*3/uL (ref 4.0–10.5)
nRBC: 0 % (ref 0.0–0.2)

## 2022-09-17 MED ORDER — IOHEXOL 300 MG/ML  SOLN
100.0000 mL | Freq: Once | INTRAMUSCULAR | Status: AC | PRN
  Administered 2022-09-17: 100 mL via INTRAVENOUS

## 2022-09-17 MED ORDER — POTASSIUM CHLORIDE 10 MEQ/100ML IV SOLN
10.0000 meq | Freq: Once | INTRAVENOUS | Status: AC
  Administered 2022-09-17: 10 meq via INTRAVENOUS
  Filled 2022-09-17: qty 100

## 2022-09-17 MED ORDER — POTASSIUM CHLORIDE 10 MEQ/100ML IV SOLN
10.0000 meq | INTRAVENOUS | Status: AC
  Administered 2022-09-17: 10 meq via INTRAVENOUS
  Filled 2022-09-17 (×2): qty 100

## 2022-09-17 MED ORDER — IOHEXOL 9 MG/ML PO SOLN
500.0000 mL | ORAL | Status: AC
  Administered 2022-09-17: 500 mL via ORAL

## 2022-09-17 NOTE — Progress Notes (Signed)
Progress Note   Patient: Rose Baker [id number] DOB: 10/21/1985 DOA: 09/14/2022     2 DOS: the patient was seen and examined on 09/17/2022   Brief hospital course: 37 y.o. female with medical history significant for Anxiety, who presents to the ED for the second time in 24 hours with intractable vomiting.  She denies abdominal pain, diarrhea, dysuria, fever or chills.  On her first visit she was treated and discharged on Phenergan suppositories which she did not fill until today however, on the morning of arrival she awoke vomiting and has been intractable all day prompting the second visit to the ED. ED course and data review: Temp 99.4.  BP initially normal but dipped as low as 101/49.  Other vitals within normal limits. Labs notable for potassium of 2.5, calcium 6.7 and magnesium 1.6, all of which were normal the day prior except for magnesium which was not measured.  CBC notable for WBC of 11,000.  LFTs WNL.  UDS positive for cannabinoids. No imaging done but patient had CT abdomen and pelvis done 07/18/2022 when she presented to the ED with similar symptoms and it showed hepatic steatosis but no acute intra-abdominal process. Patient treated with Phenergan suppository which did not help and subsequently given droperidol. Electrolyte repletion was done with oral potassium and magnesium She received an IV fluid bolus Hospitalist consulted for admission.   8/31.  Patient has this feeling in her lower throat that she feels may be triggering her to vomit.  Multiple repeated episodes of vomiting with slight blood.  Advised to stop using cannabis.  Protonix ordered IV. 9/1.  Patient felt better this morning when I saw her and we tried to advance diet.  Patient vomited with trying to eat breakfast.  Placed back on clear liquid diet.  Capsaicin cream ordered for abdomen.  Conservative management.  Case discussed with GI and recommending continued conservative management. 9/2.  Had some vomiting  overnight.  And was dry heaving this morning.  Abdominal x-ray negative.  CT scan ordered.  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  Trial of capsaicin cream.  Continue Protonix 40 mg IV twice daily because this could be acid reflux/esophagitis.  With slight blood could have a Mallory-Weiss tear.  Vomited overnight.  Abdominal x-ray negative.  Made n.p.o. for CT scan.  Abdominal pain Still having some pain from vomiting.  Hypokalemia Replace IV today  Hypomagnesemia Replaced  Hypocalcemia Improved with replacement  Sinus bradycardia Asymptomatic  Hypophosphatemia Replaced  Obesity (BMI 30-39.9) BMI listed as 30.93.        Subjective: Patient coming in with nausea vomiting.  Still having some vomiting overnight.  Had a small bowel movement last night.  Having dry heaves this morning.  Patient complains of a lot of belching.  Not passing a lot of gas.  Physical Exam: Vitals:   09/17/22 0300 09/17/22 0500 09/17/22 0806 09/17/22 1156  BP: 136/88  112/74 136/85  Pulse: (!) 56  74   Resp: 20 (!) 22  17  Temp: 98.8 F (37.1 C)  98.7 F (37.1 C) 98.3 F (36.8 C)  TempSrc: Oral  Oral Oral  SpO2: 98%  98% 100%  Weight:      Height:       Physical Exam HENT:     Head: Normocephalic.     Mouth/Throat:     Pharynx: No oropharyngeal exudate.  Eyes:     General: Lids are normal.     Conjunctiva/sclera: Conjunctivae  normal.  Cardiovascular:     Rate and Rhythm: Normal rate and regular rhythm.     Heart sounds: Normal heart sounds, S1 normal and S2 normal.  Pulmonary:     Breath sounds: No decreased breath sounds, wheezing, rhonchi or rales.  Abdominal:     Tenderness: There is abdominal tenderness in the epigastric area.  Musculoskeletal:     Right lower leg: No swelling.     Left lower leg: No swelling.  Skin:    General: Skin is warm.     Findings: No rash.  Neurological:     Mental Status: She is alert and oriented  to person, place, and time.     Data Reviewed: White blood cell count 10.1, hemoglobin 12.0, platelet count 316, creatinine 0.76, AST 42, ALT 48, potassium 3.4  Disposition: Status is: Inpatient Remains inpatient appropriate because: Since patient still having symptoms we will get a CT scan.  Planned Discharge Destination: Home    Time spent: 28 minutes  Author: Alford Highland, MD 09/17/2022 12:59 PM  For on call review [email or web address].

## 2022-09-17 NOTE — Progress Notes (Signed)
Patient off the unit, with transport, for CT.

## 2022-09-17 NOTE — Consult Note (Signed)
PHARMACY CONSULT NOTE - ELECTROLYTES  Pharmacy Consult for Electrolyte Monitoring and Replacement   Recent Labs: Height: 5\' 9"  (175.3 cm) Weight: 95 kg (209 lb 7 oz) IBW/kg (Calculated) : 66.2 Estimated Creatinine Clearance: 120.4 mL/min (by C-G formula based on SCr of 0.76 mg/dL). Potassium (mmol/L)  Date Value  09/17/2022 3.4 (L)  07/12/2013 3.9   Magnesium (mg/dL)  Date Value  86/57/8469 2.6 (H)   Calcium (mg/dL)  Date Value  62/95/2841 8.2 (L)   Calcium, Total (mg/dL)  Date Value  32/44/0102 8.6   Albumin (g/dL)  Date Value  72/53/6644 3.8  07/12/2013 3.9   Phosphorus (mg/dL)  Date Value  03/47/4259 3.8   Sodium (mmol/L)  Date Value  09/17/2022 139  07/12/2013 137    Assessment  Rose Baker is a 37 y.o. female presenting with intractable nausea/vomiting. PMH significant for cannabinoid use. Pharmacy has been consulted to monitor and replace electrolytes.  Diet: CLD MIVF: N/A Pertinent medications: N/A  Goal of Therapy: Electrolytes WNL  Plan:  K 3.4, Kcl 10 mEq IV q1h x 2 doses BMP, Phos tomorrow AM  Thank you for allowing pharmacy to be a part of this patient's care.  Tressie Ellis 09/17/2022 7:51 AM

## 2022-09-18 DIAGNOSIS — R1110 Vomiting, unspecified: Secondary | ICD-10-CM | POA: Diagnosis not present

## 2022-09-18 DIAGNOSIS — R1013 Epigastric pain: Secondary | ICD-10-CM | POA: Diagnosis not present

## 2022-09-18 DIAGNOSIS — E876 Hypokalemia: Secondary | ICD-10-CM | POA: Diagnosis not present

## 2022-09-18 LAB — BASIC METABOLIC PANEL
Anion gap: 8 (ref 5–15)
BUN: 9 mg/dL (ref 6–20)
CO2: 24 mmol/L (ref 22–32)
Calcium: 8.4 mg/dL — ABNORMAL LOW (ref 8.9–10.3)
Chloride: 107 mmol/L (ref 98–111)
Creatinine, Ser: 0.97 mg/dL (ref 0.44–1.00)
GFR, Estimated: >60 mL/min (ref >60)
Glucose, Bld: 90 mg/dL (ref 70–99)
Potassium: 3.5 mmol/L (ref 3.5–5.1)
Sodium: 139 mmol/L (ref 135–145)

## 2022-09-18 LAB — PHOSPHORUS: Phosphorus: 4.8 mg/dL — ABNORMAL HIGH (ref 2.5–4.6)

## 2022-09-18 MED ORDER — SODIUM CHLORIDE 0.9 % IV SOLN: INTRAVENOUS | Status: DC | PRN

## 2022-09-18 MED ORDER — POTASSIUM CHLORIDE IN NACL 20-0.9 MEQ/L-% IV SOLN
INTRAVENOUS | Status: DC
  Filled 2022-09-18 (×7): qty 1000

## 2022-09-18 NOTE — Progress Notes (Signed)
Progress Note   Patient: Rose Baker FAO:[id number] DOB: 1986/01/13 DOA: 09/14/2022     3 DOS: the patient was seen and examined on 09/18/2022   Brief hospital course: 37 y.o. female with medical history significant for Anxiety, who presents to the ED for the second time in 24 hours with intractable vomiting.  She denies abdominal pain, diarrhea, dysuria, fever or chills.  On her first visit she was treated and discharged on Phenergan suppositories which she did not fill until today however, on the morning of arrival she awoke vomiting and has been intractable all day prompting the second visit to the ED. ED course and data review: Temp 99.4.  BP initially normal but dipped as low as 101/49.  Other vitals within normal limits. Labs notable for potassium of 2.5, calcium 6.7 and magnesium 1.6, all of which were normal the day prior except for magnesium which was not measured.  CBC notable for WBC of 11,000.  LFTs WNL.  UDS positive for cannabinoids. No imaging done but patient had CT abdomen and pelvis done 07/18/2022 when she presented to the ED with similar symptoms and it showed hepatic steatosis but no acute intra-abdominal process. Patient treated with Phenergan suppository which did not help and subsequently given droperidol. Electrolyte repletion was done with oral potassium and magnesium She received an IV fluid bolus Hospitalist consulted for admission.   8/31.  Patient has this feeling in her lower throat that she feels may be triggering her to vomit.  Multiple repeated episodes of vomiting with slight blood.  Advised to stop using cannabis.  Protonix ordered IV. 9/1.  Patient felt better this morning when I saw her and we tried to advance diet.  Patient vomited with trying to eat breakfast.  Placed back on clear liquid diet.  Capsaicin cream ordered for abdomen.  Conservative management.  Case discussed with GI and recommending continued conservative management. 9/2.  Had some vomiting  overnight.  And was dry heaving this morning.  Abdominal x-ray negative.  CT scan with thickening of distal esophagus likely esophagitis versus reflux. 9/3.  Try to advance diet to solid food this morning but patient started vomiting.  Made n.p.o. for lunch and will start clear liquids for dinner.  Will get gastroenterology consultation.  Assessment and Plan: * Intractable vomiting CT scan showing thickening of distal esophagus could go along with esophagitis versus reflux.  On empiric Protonix twice a day.  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  Trial of capsaicin cream.  Tried to advance diet this morning and started vomiting again.  Made n.p.o. for lunch and will do liquid diet for dinner.  Will obtain gastroenterology consultation since patient has not done well with conservative management for the past few days but  Abdominal pain Still having some pain from vomiting.  Hypokalemia Replaced yesterday.  Will add potassium and IV fluids.  Hypomagnesemia Replaced  Hypocalcemia Improved with replacement  Sinus bradycardia Asymptomatic  Hypophosphatemia Replaced  Obesity (BMI 30-39.9) BMI listed as 30.93.        Subjective: Patient did okay overnight with a full liquid diet.  This morning with trying to eat solid foods started vomiting.  Not feeling well this morning.  Admitted with persistent vomiting.  Physical Exam: Vitals:   09/18/22 0330 09/18/22 0807 09/18/22 0900 09/18/22 1142  BP: 100/63 112/76  (!) 142/94  Pulse: (!) 58 72  64  Resp: 18 18 20 16   Temp: 98.5 F (36.9 C) 98.4 F (36.9 C)    TempSrc:  Oral Oral    SpO2: 98% 98%  100%  Weight:      Height:       Physical Exam HENT:     Head: Normocephalic.     Mouth/Throat:     Pharynx: No oropharyngeal exudate.  Eyes:     General: Lids are normal.     Conjunctiva/sclera: Conjunctivae normal.  Cardiovascular:     Rate and Rhythm: Normal rate and regular rhythm.     Heart sounds: Normal heart sounds, S1  normal and S2 normal.  Pulmonary:     Breath sounds: No decreased breath sounds, wheezing, rhonchi or rales.  Abdominal:     Tenderness: There is abdominal tenderness in the epigastric area.  Musculoskeletal:     Right lower leg: No swelling.     Left lower leg: No swelling.  Skin:    General: Skin is warm.     Findings: No rash.  Neurological:     Mental Status: She is alert and oriented to person, place, and time.     Data Reviewed: Creatinine 0.97, potassium 3.5  Disposition: Status is: Inpatient Remains inpatient appropriate because: Started vomiting again after breakfast.  Planned Discharge Destination: Home    Time spent: 27 minutes Case discussed with gastroenterology and nursing staff.  Author: Alford Highland, MD 09/18/2022 1:56 PM  For on call review [email or web address].

## 2022-09-18 NOTE — Consult Note (Signed)
PHARMACY CONSULT NOTE - ELECTROLYTES  Pharmacy Consult for Electrolyte Monitoring and Replacement   Recent Labs: Height: 5\' 9"  (175.3 cm) Weight: 95 kg (209 lb 7 oz) IBW/kg (Calculated) : 66.2 Estimated Creatinine Clearance: 99.3 mL/min (by C-G formula based on SCr of 0.97 mg/dL). Potassium (mmol/L)  Date Value  09/18/2022 3.5  07/12/2013 3.9   Magnesium (mg/dL)  Date Value  60/45/4098 2.6 (H)   Calcium (mg/dL)  Date Value  11/91/4782 8.4 (L)   Calcium, Total (mg/dL)  Date Value  95/62/1308 8.6   Albumin (g/dL)  Date Value  65/78/4696 3.8  07/12/2013 3.9   Phosphorus (mg/dL)  Date Value  29/52/8413 4.8 (H)   Sodium (mmol/L)  Date Value  09/18/2022 139  07/12/2013 137    Assessment  Rose Baker is a 37 y.o. female presenting with intractable nausea/vomiting. PMH significant for cannabinoid use. Pharmacy has been consulted to monitor and replace electrolytes.  Diet: CLD MIVF: N/A Pertinent medications: N/A  Goal of Therapy: Electrolytes WNL  Plan:  No replacement needed Recheck BMP and Mg in AM  Thank you for involving pharmacy in this patient's care.   Rockwell Alexandria, PharmD Clinical Pharmacist 09/18/2022 7:17 AM

## 2022-09-18 NOTE — Consult Note (Signed)
Consultation  Referring Provider:     Dr Renae Gloss Admit date 09/15/22 Consult date      09/17/22   Reason for Consultation:    NV          HPI:   Rose Baker is a 37 y.o. female with history of anxiety who was admittent 2d ago with intractable NV- seen twice in ED prior to admission treated with fluids, antiemetics - ultimately without sustained response. It is noted she uses cannabis. She is not anemic. Liver enzymes normal on admission- however has some minimal transaminase elevation today (42/48). Lipase normal. She has been treated for hypokalemia/hypomagnesia. She is not pregnant Had CT A/P yesterday: IMPRESSION: 1. Minimal wall thickening of the distal esophagus, can be seen with reflux or esophagitis. 2. Hepatic steatosis.  Patient reports she is feeling better today- that the fluids and electrolyte replacement/medications have helped. States she has not used any marijuana in 3w and stopped metformin a month ago.  Denies nsaids & herbs. States she does not like to take medications regularly Has history of CCY- had some mild abdominal upset after that but this stopped.  States recently she has been having problems more with diarrhea- at least one loose stool/d- and her NV over the last several weeks but otherwise states no real problems with her abdomen. Does report problems with anxiety- states this contributes to her abdominal symptoms- feels queasy and then starts vomting. States she has normally been having bilious vomiting although describes some coffee grounds emesis with red streaks in it 2d ago Has had some periumbilical cramping intermittently but not now. Denies any melena/hematochezia, further GI concerns. Mother had colon polyps- unsure what age MGM  had colon cancer  PREVIOUS ENDOSCOPIES:               Past Medical History:  Diagnosis Date   Anxiety    Depression    Iron deficiency anemia due to chronic blood loss 08/12/2018    Past Surgical History:  Procedure  Laterality Date   CHOLECYSTECTOMY     WISDOM TOOTH EXTRACTION      Family History  Problem Relation Age of Onset   Healthy Mother    Diabetes Father    Leukemia Maternal Aunt    Colon cancer Maternal Grandmother    Breast cancer Maternal Grandmother      Social History   Tobacco Use   Smoking status: Some Days    Current packs/day: 0.25    Types: Cigarettes   Smokeless tobacco: Never   Tobacco comments:    1-2 cigarettes a week   Vaping Use   Vaping status: Never Used  Substance Use Topics   Alcohol use: Yes    Alcohol/week: 2.0 standard drinks of alcohol    Types: 2 Glasses of wine per week   Drug use: No    Prior to Admission medications   Medication Sig Start Date End Date Taking? Authorizing Provider  metFORMIN (GLUCOPHAGE-XR) 500 MG 24 hr tablet Take 500 mg by mouth daily with breakfast. 05/18/22 05/18/23 Yes [provider]  promethazine (PHENERGAN) 12.5 MG suppository Place 1 suppository (12.5 mg total) rectally every 6 (six) hours as needed for nausea or vomiting. Patient not taking: Reported on 09/15/2022 09/13/22   Minna Antis, MD  valACYclovir (VALTREX) 500 MG tablet Take 500 mg by mouth daily. Patient not taking: Reported on 09/15/2022 07/22/22   [provider]  dicyclomine (BENTYL) 20 MG tablet Take 1 tablet (20 mg total) by mouth 3 (  three) times daily as needed (abdominal pain). 02/11/16 12/25/18  Phineas Semen, MD    Current Facility-Administered Medications  Medication Dose Route Frequency Provider Last Rate Last Admin   0.9 %  sodium chloride infusion   Intravenous PRN Alford Highland, MD 10 mL/hr at 09/18/22 1213 Infusion Verify at 09/18/22 1213   0.9 % NaCl with KCl 20 mEq/ L  infusion   Intravenous Continuous Alford Highland, MD 75 mL/hr at 09/18/22 1213 New Bag at 09/18/22 1213   acetaminophen (TYLENOL) tablet 650 mg  650 mg Oral Q6H PRN Andris Baumann, MD       Or   acetaminophen (TYLENOL) suppository 650 mg  650 mg Rectal  Q6H PRN Andris Baumann, MD       capsaicin (ZOSTRIX) 0.025 % cream   Topical BID Alford Highland, MD   Given at 09/17/22 2155   ondansetron (ZOFRAN) tablet 4 mg  4 mg Oral Q6H PRN Andris Baumann, MD   4 mg at 09/18/22 4010   Or   ondansetron (ZOFRAN) injection 4 mg  4 mg Intravenous Q6H PRN Andris Baumann, MD   4 mg at 09/17/22 1933   pantoprazole (PROTONIX) injection 40 mg  40 mg Intravenous Q12H Alford Highland, MD   40 mg at 09/18/22 1208   promethazine (PHENERGAN) 12.5 mg in sodium chloride 0.9 % 50 mL IVPB  12.5 mg Intravenous Q6H PRN Andris Baumann, MD 200 mL/hr at 09/18/22 1213 Infusion Verify at 09/18/22 1213    Allergies as of 09/14/2022   (No Known Allergies)     Review of Systems:    All systems reviewed and negative except where noted in HPI.      Physical Exam:  Vital signs in last 24 hours: Temp:  [98.4 F (36.9 C)-99 F (37.2 C)] 98.4 F (36.9 C) (09/03 0807) Pulse Rate:  [58-72] 64 (09/03 1142) Resp:  [15-20] 16 (09/03 1142) BP: (100-142)/(58-94) 142/94 (09/03 1142) SpO2:  [97 %-100 %] 100 % (09/03 1142) Last BM Date : 09/16/22 General:   Pleasant woman in NAD Head:  Normocephalic and atraumatic. Eyes:   No icterus.   Conjunctiva pink. Ears:  Normal auditory acuity. Mouth: Mucosa pink moist, no lesions. Neck:  Supple; no masses felt Lungs:  Respirations even and unlabored. Lungs clear to auscultation bilaterally.   No wheezes, crackles, or rhonchi.  Heart:  S1S2, RRR, no MRG. No edema. Abdomen:   Flat, soft, nondistended, nontender. Normal bowel sounds. No appreciable masses or hepatomegaly. No rebound signs or other peritoneal signs. Rectal:  Not performed.  Msk:  MAEW x4, No clubbing or cyanosis. Strength 5/5. Symmetrical without gross deformities. Neurologic:  Alert and  oriented x4;  Cranial nerves II-XII intact.  Skin:  Warm, dry, pink without significant lesions or rashes. Psych:  Alert and cooperative. Normal affect.  LAB RESULTS: Recent  Labs    09/17/22 0458  WBC 10.1  HGB 12.0  HCT 36.5  PLT 316   BMET Recent Labs    09/16/22 0550 09/17/22 0458 09/18/22 0420  NA 138 139 139  K 3.7 3.4* 3.5  CL 109 110 107  CO2 23 22 24   GLUCOSE 98 111* 90  BUN 8 8 9   CREATININE 0.84 0.76 0.97  CALCIUM 8.5* 8.2* 8.4*   LFT Recent Labs    09/17/22 0458  PROT 6.2*  ALBUMIN 3.8  AST 42*  ALT 48*  ALKPHOS 43  BILITOT 1.0   PT/INR No results for input(s): "LABPROT", "INR" in  the last 72 hours.  STUDIES: CT ABDOMEN PELVIS W CONTRAST  Result Date: 09/17/2022 CLINICAL DATA:  Acute abdominal pain. EXAM: CT ABDOMEN AND PELVIS WITH CONTRAST TECHNIQUE: Multidetector CT imaging of the abdomen and pelvis was performed using the standard protocol following bolus administration of intravenous contrast. RADIATION DOSE REDUCTION: This exam was performed according to the departmental dose-optimization program which includes automated exposure control, adjustment of the mA and/or kV according to patient size and/or use of iterative reconstruction technique. CONTRAST:  OMNIPAQUE IOHEXOL 300 MG/ML  SOLN COMPARISON:  Radiograph earlier today.  CT 07/18/2022 FINDINGS: Lower chest: Clear lung bases. Hepatobiliary: Hepatic steatosis. No focal liver abnormality. Cholecystectomy without biliary dilatation. Pancreas: No ductal dilatation or inflammation. Spleen: Normal in size without focal abnormality. Adrenals/Urinary Tract: Normal adrenal glands. No hydronephrosis or perinephric edema. Homogeneous renal enhancement. No renal calculi or suspicious renal abnormality. Urinary bladder is physiologically distended without wall thickening. Stomach/Bowel: Minimal wall thickening of the distal esophagus. The stomach is nondistended. Administered enteric contrast reaches the mid small bowel. No small bowel obstruction or inflammation. Majority of the colon is decompressed. There is no obvious pericolonic edema. The appendix is normal. Vascular/Lymphatic:  No acute vascular findings. Normal caliber abdominal aorta. Circumaortic left renal vein. Patent portal vein. No abdominopelvic adenopathy. Reproductive: Anteverted uterus. Symmetric sized ovaries. No adnexal mass. Other: No free air, free fluid, or intra-abdominal fluid collection. Musculoskeletal: There are no acute or suspicious osseous abnormalities. Incidental bone island in the right proximal femur. IMPRESSION: 1. Minimal wall thickening of the distal esophagus, can be seen with reflux or esophagitis. 2. Hepatic steatosis. Electronically Signed   By: Narda Rutherford M.D.   On: 09/17/2022 17:47   DG Abd 2 Views  Result Date: 09/17/2022 CLINICAL DATA:  Nausea and vomiting. EXAM: ABDOMEN - 2 VIEW COMPARISON:  CT abdomen and pelvis dated 07/18/2022. FINDINGS: The bowel gas pattern is normal. There is no evidence of free air. No radio-opaque calculi or other significant radiographic abnormality is seen. IMPRESSION: Negative. Electronically Signed   By: Romona Curls M.D.   On: 09/17/2022 12:18       Impression / Plan:   NV- likely functional however ddx broader. Given recurrent symptoms and abnormal GI tract imaging, recommend egd for luminal evaluation. Indications/ procedure/risks discussed and patient agreeable. Continue to avoid cannabis. As anxiety is a trigger we discussed how this can affect the gi tract and discussed mgmt techniques. She may benefit from CBT/neuromodulators - recommended seeing a therapist for same. 2. Intermittent diarrhea- Considering stool pcr but also likely functional. Would like to see her back as o/p  Thank you very much for this consult. These services were provided by Vevelyn Pat, NP-C, in collaboration with Regis Bill, MD, with whom I have discussed this patient in full.   Vevelyn Pat, NP-C

## 2022-09-19 ENCOUNTER — Inpatient Hospital Stay: Payer: Medicaid Other | Admitting: General Practice

## 2022-09-19 ENCOUNTER — Inpatient Hospital Stay: Payer: Medicaid Other

## 2022-09-19 ENCOUNTER — Encounter
Admission: EM | Disposition: A | Discharge status: Home / Self Care | Payer: Self-pay | Source: Home / Self Care | Attending: Internal Medicine

## 2022-09-19 ENCOUNTER — Encounter: Payer: Self-pay | Admitting: Internal Medicine

## 2022-09-19 DIAGNOSIS — K2970 Gastritis, unspecified, without bleeding: Secondary | ICD-10-CM | POA: Insufficient documentation

## 2022-09-19 DIAGNOSIS — K209 Esophagitis, unspecified without bleeding: Secondary | ICD-10-CM | POA: Insufficient documentation

## 2022-09-19 DIAGNOSIS — R1110 Vomiting, unspecified: Secondary | ICD-10-CM | POA: Diagnosis not present

## 2022-09-19 DIAGNOSIS — E876 Hypokalemia: Secondary | ICD-10-CM | POA: Diagnosis not present

## 2022-09-19 HISTORY — PX: ESOPHAGOGASTRODUODENOSCOPY: SHX5428

## 2022-09-19 LAB — BASIC METABOLIC PANEL
Anion gap: 9 (ref 5–15)
BUN: 10 mg/dL (ref 6–20)
CO2: 20 mmol/L — ABNORMAL LOW (ref 22–32)
Calcium: 8.2 mg/dL — ABNORMAL LOW (ref 8.9–10.3)
Chloride: 107 mmol/L (ref 98–111)
Creatinine, Ser: 0.85 mg/dL (ref 0.44–1.00)
GFR, Estimated: >60 mL/min (ref >60)
Glucose, Bld: 95 mg/dL (ref 70–99)
Potassium: 3.8 mmol/L (ref 3.5–5.1)
Sodium: 136 mmol/L (ref 135–145)

## 2022-09-19 LAB — CBC
HCT: 37.7 % (ref 36.0–46.0)
Hemoglobin: 12.7 g/dL (ref 12.0–15.0)
MCH: 28.6 pg (ref 26.0–34.0)
MCHC: 33.7 g/dL (ref 30.0–36.0)
MCV: 84.9 fL (ref 80.0–100.0)
Platelets: 343 10*3/uL (ref 150–400)
RBC: 4.44 MIL/uL (ref 3.87–5.11)
RDW: 12.2 % (ref 11.5–15.5)
WBC: 8 10*3/uL (ref 4.0–10.5)
nRBC: 0 % (ref 0.0–0.2)

## 2022-09-19 LAB — MAGNESIUM: Magnesium: 2.2 mg/dL (ref 1.7–2.4)

## 2022-09-19 SURGERY — EGD (ESOPHAGOGASTRODUODENOSCOPY)
Anesthesia: General

## 2022-09-19 MED ORDER — SUCCINYLCHOLINE CHLORIDE 200 MG/10ML IV SOSY
PREFILLED_SYRINGE | INTRAVENOUS | Status: DC | PRN
Start: 2022-09-19 — End: 2022-09-19
  Administered 2022-09-19: 120 mg via INTRAVENOUS

## 2022-09-19 MED ORDER — DEXMEDETOMIDINE HCL IN NACL 80 MCG/20ML IV SOLN
INTRAVENOUS | Status: DC | PRN
Start: 2022-09-19 — End: 2022-09-19
  Administered 2022-09-19: 8 ug via INTRAVENOUS

## 2022-09-19 MED ORDER — PROPOFOL 10 MG/ML IV BOLUS
INTRAVENOUS | Status: DC | PRN
  Administered 2022-09-19: 150 ug/kg/min via INTRAVENOUS
  Administered 2022-09-19: 200 ug/kg/min via INTRAVENOUS
  Administered 2022-09-19: 150 mg via INTRAVENOUS
  Administered 2022-09-19: 100 mg via INTRAVENOUS

## 2022-09-19 MED ORDER — METOCLOPRAMIDE HCL 5 MG/ML IJ SOLN
10.0000 mg | Freq: Three times a day (TID) | INTRAMUSCULAR | Status: DC
  Administered 2022-09-19 – 2022-09-21 (×6): 10 mg via INTRAVENOUS
  Filled 2022-09-19 (×6): qty 2

## 2022-09-19 MED ORDER — ENSURE ENLIVE PO LIQD
237.0000 mL | Freq: Two times a day (BID) | ORAL | Status: DC
  Administered 2022-09-20 – 2022-09-21 (×2): 237 mL via ORAL

## 2022-09-19 MED ORDER — SODIUM CHLORIDE 0.9 % IV SOLN: INTRAVENOUS | Status: DC

## 2022-09-19 NOTE — Plan of Care (Signed)
Pt still reports persistent nausea and vomiting during this shift. PRN meds given with little relief. Pt attempted to drink clear liquids post endoscopy today and she threw up immediately after. MD notified. MD rounded and informed pt of the plan of care. Pt has new orders for Reglan IV q8h, Ensure  and orders for a soft diet. I encouraged pt to make another attempt to eat this evening for dinner and pt refused. Will continue to assess.

## 2022-09-19 NOTE — Anesthesia Preprocedure Evaluation (Signed)
Anesthesia Evaluation  Patient identified by MRN, date of birth, ID band Patient awake    Reviewed: Allergy & Precautions, NPO status , Patient's Chart, lab work & pertinent test results  History of Anesthesia Complications Negative for: history of anesthetic complications  Airway Mallampati: III  TM Distance: >3 FB Neck ROM: full    Dental  (+) Chipped, Dental Advidsory Given   Pulmonary neg pulmonary ROS, Current Smoker   Pulmonary exam normal        Cardiovascular negative cardio ROS Normal cardiovascular exam     Neuro/Psych negative neurological ROS  negative psych ROS   GI/Hepatic negative GI ROS, Neg liver ROS,,,  Endo/Other  diabetes    Renal/GU negative Renal ROS  negative genitourinary   Musculoskeletal   Abdominal   Peds  Hematology negative hematology ROS (+)   Anesthesia Other Findings Patient states she has been vomiting mutliple times a day over the past 48 hrs. States that each time she throws up it is about 300-400 cc of vomitus. Patient is not passing gas but had a bowel movement a few days ago. Discussed general anesthesia with a breathing tube due to the increased aspiration risk. Patient understands and agrees to continue.  Past Medical History: No date: Anxiety No date: Depression 08/12/2018: Iron deficiency anemia due to chronic blood loss  Past Surgical History: No date: CHOLECYSTECTOMY No date: WISDOM TOOTH EXTRACTION  BMI    Body Mass Index: 30.93 kg/m      Reproductive/Obstetrics negative OB ROS                             Anesthesia Physical Anesthesia Plan  ASA: 2  Anesthesia Plan: General   Post-op Pain Management: Minimal or no pain anticipated   Induction: Intravenous and Rapid sequence  PONV Risk Score and Plan: 3 and Propofol infusion, TIVA and Ondansetron  Airway Management Planned: Oral ETT  Additional Equipment: None  Intra-op  Plan:   Post-operative Plan: Extubation in OR  Informed Consent: I have reviewed the patients History and Physical, chart, labs and discussed the procedure including the risks, benefits and alternatives for the proposed anesthesia with the patient or authorized representative who has indicated his/her understanding and acceptance.     Dental advisory given  Plan Discussed with: CRNA and Surgeon  Anesthesia Plan Comments: (Patient consented for risks of anesthesia including but not limited to:  - adverse reactions to medications - damage to eyes, teeth, lips or other oral mucosa - nerve damage due to positioning  - sore throat or hoarseness - Damage to heart, brain, nerves, lungs, other parts of body or loss of life  Patient voiced understanding.)       Anesthesia Quick Evaluation

## 2022-09-19 NOTE — Transfer of Care (Signed)
Immediate Anesthesia Transfer of Care Note  Patient: Rose Baker  Procedure(s) Performed: ESOPHAGOGASTRODUODENOSCOPY (EGD)  Patient Location: PACU  Anesthesia Type:General  Level of Consciousness: awake, drowsy, and patient cooperative  Airway & Oxygen Therapy: Patient Spontanous Breathing  Post-op Assessment: Report given to RN and Post -op Vital signs reviewed and stable  Post vital signs: stable  Last Vitals:  Vitals Value Taken Time  BP 96/60 09/19/22 1219  Temp 36.6 C 09/19/22 1219  Pulse 92 09/19/22 1222  Resp 31 09/19/22 1222  SpO2 94 % 09/19/22 1222  Vitals shown include unfiled device data.  Last Pain:  Vitals:   09/19/22 1219  TempSrc: Temporal  PainSc: Asleep         Complications: No notable events documented.

## 2022-09-19 NOTE — Progress Notes (Signed)
  Progress Note   Patient: Rose Baker NWG:[id number] DOB: October 17, 1985 DOA: 09/14/2022     4 DOS: the patient was seen and examined on 09/19/2022   Brief hospital course: 37 y.o. female with medical history significant for Anxiety, who presents to the ED for the second time in 24 hours with intractable vomiting.     Labs notable for potassium of 2.5, calcium 6.7 and magnesium 1.6.  UDS positive for cannabinoids. No imaging done but patient had CT abdomen and pelvis done 07/18/2022 when she presented to the ED with similar symptoms and it showed hepatic steatosis but no acute intra-abdominal process. Patient treated with Phenergan suppository which did not help and subsequently given droperidol. She is treated with IV fluids and symptomatic treatment.  EKG performed 9/4 showed gastritis and esophagitis due to vomiting.      Principal Problem:   Intractable vomiting Active Problems:   Abdominal pain   Hypokalemia   Hypomagnesemia   Hypocalcemia   Obesity (BMI 30-39.9)   Hypophosphatemia   Sinus bradycardia   Gastritis   Esophagitis   Assessment and Plan: * Intractable vomiting Cyclic vomiting. Marijuana use. Patient urine study still positive for marijuana, however her last use was about 1 week ago.  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  Will continue IV fluids and symptomatic treatment.  She is also started on Protonix for gastritis and esophagitis. Since her Phenergan and Zofran was not effective enough, I will try Reglan. Patient also had a CT head scan per GI to rule out central nervous system disease caused nausea vomiting.   Hypokalemia Hypomagnesemia Hypocalcemia Hypophosphatemia All improved, recheck levels tomorrow.  Sinus bradycardia Asymptomatic   Obesity (BMI 30-39.9) BMI listed as 30.93.       Subjective:  Patient still nauseous, occasional vomiting.  Not tolerating soft diet.  Physical Exam: Vitals:   09/19/22 0802  09/19/22 1219 09/19/22 1313 09/19/22 1409  BP: (!) 141/97 96/60 134/80 (!) 142/81  Pulse: 63  71 62  Resp: 20   18  Temp: 98 F (36.7 C) 97.8 F (36.6 C) 98.4 F (36.9 C) 98.9 F (37.2 C)  TempSrc:  Temporal Oral Oral  SpO2: 100%  96% 100%  Weight:      Height:       General exam: Appears calm and comfortable  Respiratory system: Clear to auscultation. Respiratory effort normal. Cardiovascular system: S1 & S2 heard, RRR. No JVD, murmurs, rubs, gallops or clicks. No pedal edema. Gastrointestinal system: Abdomen is nondistended, soft and nontender. No organomegaly or masses felt. Normal bowel sounds heard. Central nervous system: Alert and oriented. No focal neurological deficits. Extremities: Symmetric 5 x 5 power. Skin: No rashes, lesions or ulcers Psychiatry: Judgement and insight appear normal. Mood & affect appropriate.    Data Reviewed:  CT scan, lab results reviewed.  Family Communication: None  Disposition: Status is: Inpatient Remains inpatient appropriate because: Severity of disease, IV treatment.     Time spent: 35 minutes  Author: Marrion Coy, MD 09/19/2022 4:04 PM  For on call review [email or web address].

## 2022-09-19 NOTE — Op Note (Addendum)
[facility] Gastroenterology Patient Name: Rose Baker Procedure Date: 09/19/2022 11:37 AM MRN: [id number] Account #: [id number] Date of Birth: 05/14/1985 Admit Type: Inpatient Age: 37 Room: [facility] ENDO ROOM 4 Gender: Female Note Status: Supervisor Override Instrument Name: Patton Salles Endoscope [id number] Procedure:             Upper GI endoscopy Indications:           Nausea with vomiting Providers:             Eather Colas MD, MD Medicines:             Monitored Anesthesia Care Complications:         No immediate complications. Procedure:             Pre-Anesthesia Assessment:                        - Prior to the procedure, a History and Physical was                         performed, and patient medications and allergies were                         reviewed. The patient is competent. The risks and                         benefits of the procedure and the sedation options and                         risks were discussed with the patient. All questions                         were answered and informed consent was obtained.                         Patient identification and proposed procedure were                         verified by the physician, the nurse, the                         anesthesiologist, the anesthetist and the technician                         in the endoscopy suite. Mental Status Examination:                         alert and oriented. Airway Examination: normal                         oropharyngeal airway and neck mobility. Respiratory                         Examination: clear to auscultation. CV Examination:                         normal. Prophylactic Antibiotics: The patient does not                         require prophylactic antibiotics. Prior  Anticoagulants: The patient has taken no anticoagulant                         or antiplatelet agents. ASA Grade Assessment: II - A                         patient with mild  systemic disease. After reviewing                         the risks and benefits, the patient was deemed in                         satisfactory condition to undergo the procedure. The                         anesthesia plan was to use monitored anesthesia care                         (MAC). Immediately prior to administration of                         medications, the patient was re-assessed for adequacy                         to receive sedatives. The heart rate, respiratory                         rate, oxygen saturations, blood pressure, adequacy of                         pulmonary ventilation, and response to care were                         monitored throughout the procedure. The physical                         status of the patient was re-assessed after the                         procedure.                        After obtaining informed consent, the endoscope was                         passed under direct vision. Throughout the procedure,                         the patient's blood pressure, pulse, and oxygen                         saturations were monitored continuously. The Endoscope                         was introduced through the mouth, and advanced to the                         second part of duodenum. The upper GI endoscopy was  accomplished without difficulty. The patient tolerated                         the procedure well. Findings:      LA Grade A (one or more mucosal breaks less than 5 mm, not extending       between tops of 2 mucosal folds) esophagitis with no bleeding was found.      Localized moderate inflammation characterized by erythema was found in       the gastric fundus. This is associated with repeated vomiting episodes.      The exam of the stomach was otherwise normal.      The examined duodenum was normal. Impression:            - LA Grade A esophagitis with no bleeding.                        - Gastritis.                         - Normal examined duodenum.                        - No specimens collected. Recommendation:        - Return patient to hospital ward for ongoing care.                        - Advance diet as tolerated.                        - Continue present medications.                        - Could consider CT head imaging to rule out a space                         occupying lesion. Procedure Code(s):     --- Professional ---                        [phone], Esophagogastroduodenoscopy, flexible,                         transoral; diagnostic, including collection of                         specimen(s) by brushing or washing, when performed                         (separate procedure) Diagnosis Code(s):     --- Professional ---                        K20.90, Esophagitis, unspecified without bleeding                        K29.70, Gastritis, unspecified, without bleeding                        R11.2, Nausea with vomiting, unspecified CPT copyright 2022 [employer]. All rights reserved. The codes documented in this report are preliminary and upon coder review may  be revised to meet current compliance requirements.  Eather Colas MD, MD 09/19/2022 12:20:09 PM Number of Addenda: 0 Note Initiated On: 09/19/2022 11:37 AM Estimated Blood Loss:  Estimated blood loss: none.      [facility]

## 2022-09-19 NOTE — Consult Note (Signed)
PHARMACY CONSULT NOTE - ELECTROLYTES  Pharmacy Consult for Electrolyte Monitoring and Replacement   Recent Labs: Height: 5\' 9"  (175.3 cm) Weight: 95 kg (209 lb 7 oz) IBW/kg (Calculated) : 66.2 Estimated Creatinine Clearance: 113.3 mL/min (by C-G formula based on SCr of 0.85 mg/dL). Potassium (mmol/L)  Date Value  09/19/2022 3.8  07/12/2013 3.9   Magnesium (mg/dL)  Date Value  96/04/5407 2.2   Calcium (mg/dL)  Date Value  81/19/1478 8.2 (L)   Calcium, Total (mg/dL)  Date Value  29/56/2130 8.6   Albumin (g/dL)  Date Value  86/57/8469 3.8  07/12/2013 3.9   Phosphorus (mg/dL)  Date Value  62/95/2841 4.8 (H)   Sodium (mmol/L)  Date Value  09/19/2022 136  07/12/2013 137   Assessment  Rose Baker is a 37 y.o. female presenting with intractable nausea/vomiting. PMH significant for cannabinoid use. Pharmacy has been consulted to monitor and replace electrolytes.  Diet: CLD MIVF: N/A Pertinent medications: N/A  Goal of Therapy: Electrolytes WNL  Plan:  No electrolyte replacement warranted for today Recheck BMP, Phos in AM  Thank you for involving pharmacy in this patient's care.   Rockwell Alexandria, PharmD Clinical Pharmacist 09/19/2022 7:47 AM

## 2022-09-20 ENCOUNTER — Encounter: Payer: Self-pay | Admitting: Gastroenterology

## 2022-09-20 DIAGNOSIS — K209 Esophagitis, unspecified without bleeding: Secondary | ICD-10-CM

## 2022-09-20 DIAGNOSIS — K2900 Acute gastritis without bleeding: Secondary | ICD-10-CM

## 2022-09-20 LAB — BASIC METABOLIC PANEL
Anion gap: 9 (ref 5–15)
BUN: 9 mg/dL (ref 6–20)
CO2: 21 mmol/L — ABNORMAL LOW (ref 22–32)
Calcium: 8.2 mg/dL — ABNORMAL LOW (ref 8.9–10.3)
Chloride: 107 mmol/L (ref 98–111)
Creatinine, Ser: 0.79 mg/dL (ref 0.44–1.00)
GFR, Estimated: >60 mL/min (ref >60)
Glucose, Bld: 82 mg/dL (ref 70–99)
Potassium: 3.7 mmol/L (ref 3.5–5.1)
Sodium: 137 mmol/L (ref 135–145)

## 2022-09-20 LAB — PHOSPHORUS: Phosphorus: 3.6 mg/dL (ref 2.5–4.6)

## 2022-09-20 LAB — MAGNESIUM: Magnesium: 2.2 mg/dL (ref 1.7–2.4)

## 2022-09-20 NOTE — Plan of Care (Signed)
Pt is still reporting nausea this shift. New symptom of diarrhea x 1 reported. MD made aware. No new orders. Pt appears to do well with zofran and reglan doses given together. Will continue to monitor symptoms.

## 2022-09-20 NOTE — Progress Notes (Signed)
  Progress Note   Patient: Rose Baker OZH:[id number] DOB: [birth year] DOA: 09/14/2022     5 DOS: the patient was seen and examined on 09/20/2022   Brief hospital course: 37 y.o. female with medical history significant for Anxiety, who presents to the ED for the second time in 24 hours with intractable vomiting.     Labs notable for potassium of 2.5, calcium 6.7 and magnesium 1.6.  UDS positive for cannabinoids. No imaging done but patient had CT abdomen and pelvis done 07/18/2022 when she presented to the ED with similar symptoms and it showed hepatic steatosis but no acute intra-abdominal process. Patient treated with Phenergan suppository which did not help and subsequently given droperidol. She is treated with IV fluids and symptomatic treatment.  EKG performed 9/4 showed gastritis and esophagitis due to vomiting.      Principal Problem:   Intractable vomiting Active Problems:   Abdominal pain   Hypokalemia   Hypomagnesemia   Hypocalcemia   Obesity (BMI 30-39.9)   Hypophosphatemia   Sinus bradycardia   Gastritis   Esophagitis   Assessment and Plan: * Intractable vomiting Cyclic vomiting. Marijuana use. Patient urine study still positive for marijuana, however her last use was about 1 week ago.  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  Will continue IV fluids and symptomatic treatment.  She is also started on Protonix for gastritis and esophagitis. Since her Phenergan and Zofran was not effective enough, I also added Reglan. CT head without contrast was normal. Patient still has significant nausea and vomiting this morning, but was able to tolerate some diet yesterday.  Will continue some fluids, continue symptomatic treatment.  Likely discharge home tomorrow.     Hypokalemia Hypomagnesemia Hypocalcemia Hypophosphatemia Improved.   Sinus bradycardia Asymptomatic     Obesity (BMI 30-39.9) BMI listed as 30.93.        Subjective:  Patient  had  more nausea vomit this morning.  No abdominal pain.  Physical Exam: Vitals:   09/19/22 2302 09/20/22 0350 09/20/22 0748 09/20/22 1114  BP: 112/66 113/71 (!) 147/95 126/80  Pulse: 64 (!) 57 60 88  Resp: 18 18 20 20   Temp: 98.9 F (37.2 C) 98.9 F (37.2 C) 98.4 F (36.9 C) 99 F (37.2 C)  TempSrc: Oral Oral Oral Oral  SpO2: 98% 98% 99% 98%  Weight:      Height:       General exam: Appears calm and comfortable  Respiratory system: Clear to auscultation. Respiratory effort normal. Cardiovascular system: S1 & S2 heard, RRR. No JVD, murmurs, rubs, gallops or clicks. No pedal edema. Gastrointestinal system: Abdomen is nondistended, soft and nontender. No organomegaly or masses felt. Normal bowel sounds heard. Central nervous system: Alert and oriented. No focal neurological deficits. Extremities: Symmetric 5 x 5 power. Skin: No rashes, lesions or ulcers Psychiatry: Judgement and insight appear normal. Mood & affect appropriate.    Data Reviewed:  Lab results reviewed, CT head reviewed.  Family Communication: Boyfriend at bedside updated.  Disposition: Status is: Inpatient Remains inpatient appropriate because: Severity of disease, IV treatment.     Time spent: 35 minutes  Author: Marrion Coy, MD 09/20/2022 1:09 PM  For on call review [email or web address].

## 2022-09-20 NOTE — Consult Note (Signed)
PHARMACY CONSULT NOTE - ELECTROLYTES  Pharmacy Consult for Electrolyte Monitoring and Replacement   Recent Labs: Height: 5\' 9"  (175.3 cm) Weight: 95 kg (209 lb 7 oz) IBW/kg (Calculated) : 66.2 Estimated Creatinine Clearance: 120.4 mL/min (by C-G formula based on SCr of 0.79 mg/dL). Potassium (mmol/L)  Date Value  09/20/2022 3.7  07/12/2013 3.9   Magnesium (mg/dL)  Date Value  16/10/9602 2.2   Calcium (mg/dL)  Date Value  54/09/8117 8.2 (L)   Calcium, Total (mg/dL)  Date Value  14/78/2956 8.6   Albumin (g/dL)  Date Value  21/30/8657 3.8  07/12/2013 3.9   Phosphorus (mg/dL)  Date Value  84/69/6295 3.6   Sodium (mmol/L)  Date Value  09/20/2022 137  07/12/2013 137   Assessment  Rose Baker is a 37 y.o. female presenting with intractable nausea/vomiting. PMH significant for cannabinoid use. Pharmacy has been consulted to monitor and replace electrolytes.  Diet: CLD MIVF: N/A Pertinent medications: N/A  Goal of Therapy: Electrolytes WNL  Plan:  No electrolyte replacement warranted for today Recheck BMP in AM   Thank you for involving pharmacy in this patient's care.   Gardner Candle, PharmD, BCPS Clinical Pharmacist 09/20/2022 9:58 AM

## 2022-09-20 NOTE — Plan of Care (Signed)
No emesis this shift; had yogurt right before start of this shift (last night around 1900); just sips through the night; has needed some meds for nausea this shift

## 2022-09-20 NOTE — Anesthesia Postprocedure Evaluation (Signed)
Anesthesia Post Note  Patient: EVERLY BURDICK  Procedure(s) Performed: ESOPHAGOGASTRODUODENOSCOPY (EGD)  Patient location during evaluation: Endoscopy Anesthesia Type: General Level of consciousness: awake and alert Pain management: pain level controlled Vital Signs Assessment: post-procedure vital signs reviewed and stable Respiratory status: spontaneous breathing, nonlabored ventilation, respiratory function stable and patient connected to nasal cannula oxygen Cardiovascular status: blood pressure returned to baseline and stable Postop Assessment: no apparent nausea or vomiting Anesthetic complications: no   There were no known notable events for this encounter.   Last Vitals:  Vitals:   09/19/22 2302 09/20/22 0350  BP: 112/66 113/71  Pulse: 64 (!) 57  Resp: 18 18  Temp: 37.2 C 37.2 C  SpO2: 98% 98%    Last Pain:  Vitals:   09/20/22 0350  TempSrc: Oral  PainSc:                  Stephanie Coup

## 2022-09-21 DIAGNOSIS — E872 Acidosis, unspecified: Secondary | ICD-10-CM | POA: Insufficient documentation

## 2022-09-21 LAB — MAGNESIUM: Magnesium: 2.2 mg/dL (ref 1.7–2.4)

## 2022-09-21 LAB — BASIC METABOLIC PANEL
Anion gap: 8 (ref 5–15)
BUN: 8 mg/dL (ref 6–20)
CO2: 21 mmol/L — ABNORMAL LOW (ref 22–32)
Calcium: 8.3 mg/dL — ABNORMAL LOW (ref 8.9–10.3)
Chloride: 109 mmol/L (ref 98–111)
Creatinine, Ser: 0.73 mg/dL (ref 0.44–1.00)
GFR, Estimated: >60 mL/min (ref >60)
Glucose, Bld: 82 mg/dL (ref 70–99)
Potassium: 3.5 mmol/L (ref 3.5–5.1)
Sodium: 138 mmol/L (ref 135–145)

## 2022-09-21 LAB — PHOSPHORUS: Phosphorus: 3.8 mg/dL (ref 2.5–4.6)

## 2022-09-21 MED ORDER — METOCLOPRAMIDE HCL 10 MG PO TABS: 10.0000 mg | ORAL_TABLET | Freq: Three times a day (TID) | ORAL | 0 refills | Status: AC

## 2022-09-21 MED ORDER — PANTOPRAZOLE SODIUM 40 MG PO TBEC: 40.0000 mg | DELAYED_RELEASE_TABLET | Freq: Every day | ORAL | 0 refills | Status: AC

## 2022-09-21 MED ORDER — SODIUM BICARBONATE 650 MG PO TABS: 650.0000 mg | ORAL_TABLET | Freq: Two times a day (BID) | ORAL | 0 refills | Status: AC

## 2022-09-21 MED ORDER — ONDANSETRON HCL 4 MG PO TABS: 4.0000 mg | ORAL_TABLET | Freq: Four times a day (QID) | ORAL | 0 refills | Status: AC | PRN

## 2022-09-21 MED ORDER — SODIUM BICARBONATE 650 MG PO TABS
650.0000 mg | ORAL_TABLET | Freq: Two times a day (BID) | ORAL | Status: DC
  Administered 2022-09-21: 650 mg via ORAL
  Filled 2022-09-21: qty 1

## 2022-09-21 MED ORDER — POTASSIUM CHLORIDE CRYS ER 20 MEQ PO TBCR
40.0000 meq | EXTENDED_RELEASE_TABLET | Freq: Once | ORAL | Status: AC
  Administered 2022-09-21: 40 meq via ORAL
  Filled 2022-09-21: qty 2

## 2022-09-21 NOTE — Consult Note (Signed)
PHARMACY CONSULT NOTE - ELECTROLYTES  Pharmacy Consult for Electrolyte Monitoring and Replacement   Recent Labs: Height: 5\' 9"  (175.3 cm) Weight: 95 kg (209 lb 7 oz) IBW/kg (Calculated) : 66.2 Estimated Creatinine Clearance: 120.4 mL/min (by C-G formula based on SCr of 0.73 mg/dL). Potassium (mmol/L)  Date Value  09/21/2022 3.5  07/12/2013 3.9   Magnesium (mg/dL)  Date Value  19/14/7829 2.2   Calcium (mg/dL)  Date Value  56/21/3086 8.3 (L)   Calcium, Total (mg/dL)  Date Value  57/84/6962 8.6   Albumin (g/dL)  Date Value  95/28/4132 3.8  07/12/2013 3.9   Phosphorus (mg/dL)  Date Value  44/01/270 3.8   Sodium (mmol/L)  Date Value  09/21/2022 138  07/12/2013 137   Assessment  Rose Baker is a 37 y.o. female presenting with intractable nausea/vomiting. PMH significant for cannabinoid use. Pharmacy has been consulted to monitor and replace electrolytes.  Diet: Soft MIVF: NS + 20 mEq K/L at 50 cc/hr Pertinent medications: N/A  Goal of Therapy: Electrolytes WNL  Plan:  Kcl 40 mEq PO x 1 per TRH Electrolytes have been stable overall. Will discontinue electrolyte consult at this time. Defer further ordering of labs and electrolyte replacement to primary team Pharmacy will continue to follow along peripherally  Thank you for involving pharmacy in this patient's care.   Tressie Ellis 09/21/2022 10:19 AM

## 2022-09-21 NOTE — Progress Notes (Signed)
Patient discharged. Discharge instructions given. Patient verbalizes understanding. Transported by axillary. 

## 2022-09-21 NOTE — Discharge Summary (Signed)
Physician Discharge Summary   Patient: Rose Baker MRN: [id number] DOB: [birth year]  Admit date:     09/14/2022  Discharge date: 09/21/22  Discharge Physician: Marrion Coy   PCP: Margaretann Loveless, MD   Recommendations at discharge:   Follow-up with PCP as outpatient in 1 week.  Discharge Diagnoses: Principal Problem:   Intractable vomiting Active Problems:   Abdominal pain   Hypokalemia   Hypomagnesemia   Hypocalcemia   Obesity (BMI 30-39.9)   Hypophosphatemia   Sinus bradycardia   Gastritis   Esophagitis   Metabolic acidosis  Resolved Problems:   * No resolved hospital problems. *  Hospital Course: 37 y.o. female with medical history significant for Anxiety, who presents to the ED for the second time in 24 hours with intractable vomiting.     Labs notable for potassium of 2.5, calcium 6.7 and magnesium 1.6.  UDS positive for cannabinoids. No imaging done but patient had CT abdomen and pelvis done 07/18/2022 when she presented to the ED with similar symptoms and it showed hepatic steatosis but no acute intra-abdominal process. Patient treated with Phenergan suppository which did not help and subsequently given droperidol. She is treated with IV fluids and symptomatic treatment.  EKG performed 9/4 showed gastritis and esophagitis due to vomiting.  Patient condition finally improved today, tolerating diet, nausea vomiting much better.  Medically stable for discharge.     Assessment and Plan:  * Intractable vomiting Cyclic vomiting. Marijuana use. Patient urine study still positive for marijuana, however her last use was about 1 week ago.  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  Will continue IV fluids and symptomatic treatment.  She is also started on Protonix for gastritis and esophagitis. Since her Phenergan and Zofran was not effective enough, I also added Reglan. CT head without contrast was normal. Patient was continued on IV fluids.   Condition finally improved, patient is tolerating diet without vomiting.  Medically stable for discharge, will continue 3 days of Zofran as needed and scheduled 3 days of Reglan.  Continue a few days of Protonix.  Follow-up with PCP as outpatient.     Hypokalemia Hypomagnesemia Hypocalcemia Hypophosphatemia Mild metabolic acidosis. Potassium is 3.5 today, will give additional 40 mEq of KCl orally today.  CO2 level 21, will give 5 days of sodium bicarb orally.   Sinus bradycardia Asymptomatic     Obesity (BMI 30-39.9) BMI listed as 30.93.      Consultants: None Procedures performed: None  Disposition: Home Diet recommendation:  Discharge Diet Orders (From admission, onward)     Start     Ordered   09/21/22 0000  Diet - low sodium heart healthy        09/21/22 1038           Cardiac diet DISCHARGE MEDICATION: Allergies as of 09/21/2022   No Known Allergies      Medication List     STOP taking these medications    promethazine 12.5 MG suppository Commonly known as: PHENERGAN   valACYclovir 500 MG tablet Commonly known as: VALTREX       TAKE these medications    metFORMIN 500 MG 24 hr tablet Commonly known as: GLUCOPHAGE-XR Take 500 mg by mouth daily with breakfast.   metoCLOPramide 10 MG tablet Commonly known as: REGLAN Take 1 tablet (10 mg total) by mouth 3 (three) times daily with meals for 3 days.   ondansetron 4 MG tablet Commonly known as: ZOFRAN Take 1 tablet (4 mg total)  by mouth every 6 (six) hours as needed for nausea. What changed:  when to take this reasons to take this   pantoprazole 40 MG tablet Commonly known as: Protonix Take 1 tablet (40 mg total) by mouth daily for 7 days.   sodium bicarbonate 650 MG tablet Take 1 tablet (650 mg total) by mouth 2 (two) times daily for 5 days.        Follow-up Information     Margaretann Loveless, MD Follow up in 1 week(s).   Specialty: Internal Medicine Contact information: [address] [place] [address] [phone]                Discharge Exam: Ceasar Mons Weights   09/14/22 1647  Weight: 95 kg   General exam: Appears calm and comfortable  Respiratory system: Clear to auscultation. Respiratory effort normal. Cardiovascular system: S1 & S2 heard, RRR. No JVD, murmurs, rubs, gallops or clicks. No pedal edema. Gastrointestinal system: Abdomen is nondistended, soft and nontender. No organomegaly or masses felt. Normal bowel sounds heard. Central nervous system: Alert and oriented. No focal neurological deficits. Extremities: Symmetric 5 x 5 power. Skin: No rashes, lesions or ulcers Psychiatry: Judgement and insight appear normal. Mood & affect appropriate.    Condition at discharge: good  The results of significant diagnostics from this hospitalization (including imaging, microbiology, ancillary and laboratory) are listed below for reference.   Imaging Studies: CT HEAD WO CONTRAST ( )  Result Date: 09/19/2022 CLINICAL DATA:  Intractable nausea, vomiting. EXAM: CT HEAD WITHOUT CONTRAST TECHNIQUE: Contiguous axial images were obtained from the base of the skull through the vertex without intravenous contrast. RADIATION DOSE REDUCTION: This exam was performed according to the departmental dose-optimization program which includes automated exposure control, adjustment of the mA and/or kV according to patient size and/or use of iterative reconstruction technique. COMPARISON:  None Available. FINDINGS: Brain: There is no acute intracranial hemorrhage, extra-axial fluid collection, or acute infarct Parenchymal volume is normal. The ventricles are normal in size. Gray-white differentiation is preserved The pituitary and suprasellar region are normal. There is no mass lesion. There is no mass effect or midline shift. Vascular: No hyperdense vessel or unexpected calcification. Skull: Normal. Negative for fracture or focal lesion. Sinuses/Orbits: The imaged paranasal sinuses  are clear. The imaged globes and orbits are unremarkable. Other: The mastoid air cells and middle ear cavities are clear. IMPRESSION: Normal head CT. Electronically Signed   By: Lesia Hausen M.D.   On: 09/19/2022 18:17   CT ABDOMEN PELVIS W CONTRAST  Result Date: 09/17/2022 CLINICAL DATA:  Acute abdominal pain. EXAM: CT ABDOMEN AND PELVIS WITH CONTRAST TECHNIQUE: Multidetector CT imaging of the abdomen and pelvis was performed using the standard protocol following bolus administration of intravenous contrast. RADIATION DOSE REDUCTION: This exam was performed according to the departmental dose-optimization program which includes automated exposure control, adjustment of the mA and/or kV according to patient size and/or use of iterative reconstruction technique. CONTRAST:  OMNIPAQUE IOHEXOL 300 MG/ML  SOLN COMPARISON:  Radiograph earlier today.  CT 07/18/2022 FINDINGS: Lower chest: Clear lung bases. Hepatobiliary: Hepatic steatosis. No focal liver abnormality. Cholecystectomy without biliary dilatation. Pancreas: No ductal dilatation or inflammation. Spleen: Normal in size without focal abnormality. Adrenals/Urinary Tract: Normal adrenal glands. No hydronephrosis or perinephric edema. Homogeneous renal enhancement. No renal calculi or suspicious renal abnormality. Urinary bladder is physiologically distended without wall thickening. Stomach/Bowel: Minimal wall thickening of the distal esophagus. The stomach is nondistended. Administered enteric contrast reaches the mid small bowel.  No small bowel obstruction or inflammation. Majority of the colon is decompressed. There is no obvious pericolonic edema. The appendix is normal. Vascular/Lymphatic: No acute vascular findings. Normal caliber abdominal aorta. Circumaortic left renal vein. Patent portal vein. No abdominopelvic adenopathy. Reproductive: Anteverted uterus. Symmetric sized ovaries. No adnexal mass. Other: No free air, free fluid, or intra-abdominal  fluid collection. Musculoskeletal: There are no acute or suspicious osseous abnormalities. Incidental bone island in the right proximal femur. IMPRESSION: 1. Minimal wall thickening of the distal esophagus, can be seen with reflux or esophagitis. 2. Hepatic steatosis. Electronically Signed   By: Narda Rutherford M.D.   On: 09/17/2022 17:47   DG Abd 2 Views  Result Date: 09/17/2022 CLINICAL DATA:  Nausea and vomiting. EXAM: ABDOMEN - 2 VIEW COMPARISON:  CT abdomen and pelvis dated 07/18/2022. FINDINGS: The bowel gas pattern is normal. There is no evidence of free air. No radio-opaque calculi or other significant radiographic abnormality is seen. IMPRESSION: Negative. Electronically Signed   By: Romona Curls M.D.   On: 09/17/2022 12:18    Microbiology: Results for orders placed or performed during the hospital encounter of 12/25/18  Rapid Strep Screen ([facility] ONLY)     Status: None   Collection Time: 12/25/18 11:25 AM   Specimen: Oral Mucosa/Gingiva; Other  Result Value Ref Range Status   Streptococcus, Group A Screen (Direct) NEGATIVE NEGATIVE Final    Comment: (NOTE) A Rapid Antigen test may result negative if the antigen level in the sample is below the detection level of this test. The FDA has not cleared this test as a stand-alone test therefore the rapid antigen negative result has reflexed to a Group A Strep culture. Performed at [facility], [address] [place], [place] [address]   Culture, group A strep     Status: Abnormal   Collection Time: 12/25/18 11:25 AM   Specimen: Throat  Result Value Ref Range Status   Specimen Description   Final    THROAT Performed at [facility], [address], [place], [place] [address]    Special Requests   Final    NONE Reflexed from [phone] Performed at [facility], [address] [place], [place] [address]    Culture STREPTOCOCCUS,BETA HEMOLYTIC NOT GROUP A (A)  Final   Report Status  12/27/2018 FINAL  Final    Labs: CBC: Recent Labs  Lab 09/14/22 1746 09/15/22 0608 09/17/22 0458 09/19/22 0702  WBC 11.2* 8.4 10.1 8.0  HGB 12.5 11.0* 12.0 12.7  HCT 38.0 33.8* 36.5 37.7  MCV 88.0 89.2 86.9 84.9  PLT 361 281 316 343   Basic Metabolic Panel: Recent Labs  Lab 09/14/22 2106 09/15/22 0608 09/16/22 0550 09/17/22 0458 09/18/22 0420 09/19/22 0702 09/20/22 0538 09/21/22 0558  NA 143 140 138 139 139 136 137 138  K 2.5* 3.5 3.7 3.4* 3.5 3.8 3.7 3.5  CL 118* 112* 109 110 107 107 107 109  CO2 18* 20* 23 22 24  20* 21* 21*  GLUCOSE 102* 101* 98 111* 90 95 82 82  BUN 15 16 8 8 9 10 9 8   CREATININE 0.51 0.73 0.84 0.76 0.97 0.85 0.79 0.73  CALCIUM 6.7* 8.2* 8.5* 8.2* 8.4* 8.2* 8.2* 8.3*  MG 1.6* 2.6*  --   --   --  2.2 2.2 2.2  PHOS  --  2.2* 3.8  --  4.8*  --  3.6 3.8   Liver Function Tests: Recent Labs  Lab 09/14/22  2106 09/16/22 0550 09/17/22 0458  AST 18 29 42*  ALT 13 26 48*  ALKPHOS 36* 38 43  BILITOT 0.7 0.7 1.0  PROT 5.6* 6.4* 6.2*  ALBUMIN 3.4* 3.9 3.8   CBG: No results for input(s): "GLUCAP" in the last 168 hours.  Discharge time spent: greater than 30 minutes.  Signed: Marrion Coy, MD [facility] 09/21/2022

## 2022-09-24 NOTE — Group Note (Deleted)

## 2023-01-02 ENCOUNTER — Encounter: Payer: Self-pay | Admitting: Gastroenterology
